# Patient Record
Sex: Male | Born: 1960 | Race: White | Hispanic: No | Marital: Single | Smoking: Former smoker
Health system: Southern US, Community
[De-identification: ages and names within clinical notes are randomized; demographics above are authoritative.]

## PROBLEM LIST (undated history)

## (undated) DIAGNOSIS — F32A Depression, unspecified: Secondary | ICD-10-CM

## (undated) DIAGNOSIS — Z87442 Personal history of urinary calculi: Secondary | ICD-10-CM

## (undated) DIAGNOSIS — G4730 Sleep apnea, unspecified: Secondary | ICD-10-CM

## (undated) DIAGNOSIS — F329 Major depressive disorder, single episode, unspecified: Secondary | ICD-10-CM

## (undated) DIAGNOSIS — I10 Essential (primary) hypertension: Secondary | ICD-10-CM

## (undated) DIAGNOSIS — E119 Type 2 diabetes mellitus without complications: Secondary | ICD-10-CM

## (undated) HISTORY — PX: CHOLECYSTECTOMY: SHX55

---

## 2007-10-28 ENCOUNTER — Emergency Department (HOSPITAL_COMMUNITY): Admission: EM | Admit: 2007-10-28 | Discharge: 2007-10-29 | Payer: Self-pay | Admitting: Emergency Medicine

## 2008-03-30 ENCOUNTER — Emergency Department (HOSPITAL_COMMUNITY): Admission: EM | Admit: 2008-03-30 | Discharge: 2008-03-30 | Payer: Self-pay | Admitting: Emergency Medicine

## 2011-05-26 LAB — DIFFERENTIAL
Lymphocytes Relative: 9 — ABNORMAL LOW
Lymphs Abs: 1.2
Monocytes Absolute: 0.9
Monocytes Relative: 6
Neutro Abs: 11.8 — ABNORMAL HIGH
Neutrophils Relative %: 85 — ABNORMAL HIGH

## 2011-05-26 LAB — BASIC METABOLIC PANEL
CO2: 22
Calcium: 8.8
Creatinine, Ser: 0.65
GFR calc Af Amer: >60
Sodium: 132 — ABNORMAL LOW

## 2011-05-26 LAB — URINALYSIS, ROUTINE W REFLEX MICROSCOPIC
Bilirubin Urine: NEGATIVE
Glucose, UA: NEGATIVE
Hgb urine dipstick: NEGATIVE
Specific Gravity, Urine: >1.046 — ABNORMAL HIGH
Urobilinogen, UA: 0.2
pH: 5

## 2011-05-26 LAB — CBC
Hemoglobin: 11.4 — ABNORMAL LOW
MCHC: 33.6
RBC: 3.85 — ABNORMAL LOW
WBC: 13.8 — ABNORMAL HIGH

## 2011-05-26 LAB — URINE MICROSCOPIC-ADD ON: Urine-Other: NONE SEEN

## 2011-06-02 LAB — POCT I-STAT, CHEM 8
BUN: 23
Creatinine, Ser: 0.9
Potassium: 3.9
Sodium: 139
TCO2: 21

## 2013-09-02 ENCOUNTER — Emergency Department (HOSPITAL_COMMUNITY): Payer: Non-veteran care

## 2013-09-02 ENCOUNTER — Encounter (HOSPITAL_COMMUNITY): Payer: Self-pay | Admitting: *Deleted

## 2013-09-02 ENCOUNTER — Inpatient Hospital Stay (HOSPITAL_COMMUNITY)
Admission: EM | Admit: 2013-09-02 | Discharge: 2013-09-16 | DRG: 963 | Disposition: A | Discharge status: Skilled Nursing Facility | Payer: Non-veteran care | Attending: General Surgery | Admitting: General Surgery

## 2013-09-02 DIAGNOSIS — S2243XA Multiple fractures of ribs, bilateral, initial encounter for closed fracture: Secondary | ICD-10-CM

## 2013-09-02 DIAGNOSIS — S2220XA Unspecified fracture of sternum, initial encounter for closed fracture: Principal | ICD-10-CM

## 2013-09-02 DIAGNOSIS — F3289 Other specified depressive episodes: Secondary | ICD-10-CM | POA: Diagnosis present

## 2013-09-02 DIAGNOSIS — F329 Major depressive disorder, single episode, unspecified: Secondary | ICD-10-CM | POA: Diagnosis present

## 2013-09-02 DIAGNOSIS — S32009A Unspecified fracture of unspecified lumbar vertebra, initial encounter for closed fracture: Secondary | ICD-10-CM

## 2013-09-02 DIAGNOSIS — I959 Hypotension, unspecified: Secondary | ICD-10-CM | POA: Diagnosis present

## 2013-09-02 DIAGNOSIS — F4310 Post-traumatic stress disorder, unspecified: Secondary | ICD-10-CM | POA: Diagnosis present

## 2013-09-02 DIAGNOSIS — I10 Essential (primary) hypertension: Secondary | ICD-10-CM | POA: Diagnosis present

## 2013-09-02 DIAGNOSIS — S36899A Unspecified injury of other intra-abdominal organs, initial encounter: Secondary | ICD-10-CM | POA: Diagnosis present

## 2013-09-02 DIAGNOSIS — K5900 Constipation, unspecified: Secondary | ICD-10-CM | POA: Diagnosis not present

## 2013-09-02 DIAGNOSIS — S32029A Unspecified fracture of second lumbar vertebra, initial encounter for closed fracture: Secondary | ICD-10-CM | POA: Diagnosis present

## 2013-09-02 DIAGNOSIS — E876 Hypokalemia: Secondary | ICD-10-CM | POA: Diagnosis not present

## 2013-09-02 DIAGNOSIS — Z87891 Personal history of nicotine dependence: Secondary | ICD-10-CM

## 2013-09-02 DIAGNOSIS — J9600 Acute respiratory failure, unspecified whether with hypoxia or hypercapnia: Secondary | ICD-10-CM | POA: Diagnosis present

## 2013-09-02 DIAGNOSIS — R490 Dysphonia: Secondary | ICD-10-CM | POA: Clinically undetermined

## 2013-09-02 DIAGNOSIS — S27329A Contusion of lung, unspecified, initial encounter: Secondary | ICD-10-CM | POA: Diagnosis present

## 2013-09-02 DIAGNOSIS — S2249XA Multiple fractures of ribs, unspecified side, initial encounter for closed fracture: Secondary | ICD-10-CM

## 2013-09-02 DIAGNOSIS — Z79899 Other long term (current) drug therapy: Secondary | ICD-10-CM

## 2013-09-02 DIAGNOSIS — K560 Paralytic ileus: Secondary | ICD-10-CM | POA: Diagnosis not present

## 2013-09-02 DIAGNOSIS — N390 Urinary tract infection, site not specified: Secondary | ICD-10-CM | POA: Diagnosis present

## 2013-09-02 DIAGNOSIS — S2242XA Multiple fractures of ribs, left side, initial encounter for closed fracture: Secondary | ICD-10-CM

## 2013-09-02 DIAGNOSIS — N179 Acute kidney failure, unspecified: Secondary | ICD-10-CM

## 2013-09-02 DIAGNOSIS — R1310 Dysphagia, unspecified: Secondary | ICD-10-CM | POA: Clinically undetermined

## 2013-09-02 HISTORY — DX: Depression, unspecified: F32.A

## 2013-09-02 HISTORY — DX: Essential (primary) hypertension: I10

## 2013-09-02 HISTORY — DX: Major depressive disorder, single episode, unspecified: F32.9

## 2013-09-02 LAB — POCT I-STAT, CHEM 8
BUN: 21 mg/dL (ref 6–23)
Creatinine, Ser: 0.8 mg/dL (ref 0.50–1.35)
Hemoglobin: 15.3 g/dL (ref 13.0–17.0)
Potassium: 3.9 mEq/L (ref 3.7–5.3)
Sodium: 139 mEq/L (ref 137–147)
TCO2: 22 mmol/L (ref 0–100)

## 2013-09-02 LAB — CBC WITH DIFFERENTIAL/PLATELET
Basophils Absolute: 0.1 10*3/uL (ref 0.0–0.1)
Basophils Relative: 1 % (ref 0–1)
Eosinophils Absolute: 0.1 10*3/uL (ref 0.0–0.7)
HCT: 42.7 % (ref 39.0–52.0)
Hemoglobin: 14.7 g/dL (ref 13.0–17.0)
Lymphocytes Relative: 25 % (ref 12–46)
MCHC: 34.4 g/dL (ref 30.0–36.0)
Monocytes Relative: 7 % (ref 3–12)
Neutro Abs: 9.3 10*3/uL — ABNORMAL HIGH (ref 1.7–7.7)
Neutrophils Relative %: 66 % (ref 43–77)
RBC: 4.69 MIL/uL (ref 4.22–5.81)
RDW: 15 % (ref 11.5–15.5)
WBC: 14 10*3/uL — ABNORMAL HIGH (ref 4.0–10.5)

## 2013-09-02 LAB — POCT I-STAT 3, ART BLOOD GAS (G3+)
Acid-base deficit: 7 mmol/L — ABNORMAL HIGH (ref 0.0–2.0)
Bicarbonate: 20.3 mEq/L (ref 20.0–24.0)
O2 Saturation: 99 %
TCO2: 22 mmol/L (ref 0–100)
pO2, Arterial: 152 mmHg — ABNORMAL HIGH (ref 80.0–100.0)

## 2013-09-02 LAB — COMPREHENSIVE METABOLIC PANEL
ALT: 119 U/L — ABNORMAL HIGH (ref 0–53)
AST: 85 U/L — ABNORMAL HIGH (ref 0–37)
Albumin: 3.6 g/dL (ref 3.5–5.2)
Alkaline Phosphatase: 106 U/L (ref 39–117)
CO2: 19 mEq/L (ref 19–32)
Calcium: 9.2 mg/dL (ref 8.4–10.5)
Glucose, Bld: 178 mg/dL — ABNORMAL HIGH (ref 70–99)
Potassium: 3.9 mEq/L (ref 3.7–5.3)
Sodium: 139 mEq/L (ref 137–147)
Total Protein: 6.8 g/dL (ref 6.0–8.3)

## 2013-09-02 LAB — URINALYSIS, ROUTINE W REFLEX MICROSCOPIC
Bilirubin Urine: NEGATIVE
Glucose, UA: 100 mg/dL — AB
Leukocytes, UA: NEGATIVE
Nitrite: NEGATIVE
Specific Gravity, Urine: 1.031 — ABNORMAL HIGH (ref 1.005–1.030)
pH: 6.5 (ref 5.0–8.0)

## 2013-09-02 LAB — SAMPLE TO BLOOD BANK

## 2013-09-02 LAB — URINE MICROSCOPIC-ADD ON

## 2013-09-02 LAB — PROTIME-INR
INR: 0.91 (ref 0.00–1.49)
INR: 1.08 (ref 0.00–1.49)

## 2013-09-02 LAB — APTT: aPTT: 26 seconds (ref 24–37)

## 2013-09-02 LAB — CG4 I-STAT (LACTIC ACID): Lactic Acid, Venous: 2.59 mmol/L — ABNORMAL HIGH (ref 0.5–2.2)

## 2013-09-02 MED ORDER — SUCCINYLCHOLINE CHLORIDE 20 MG/ML IJ SOLN
100.0000 mg | Freq: Once | INTRAMUSCULAR | Status: AC
  Administered 2013-09-02: 100 mg via INTRAVENOUS

## 2013-09-02 MED ORDER — SUCCINYLCHOLINE CHLORIDE 20 MG/ML IJ SOLN
INTRAMUSCULAR | Status: AC
  Filled 2013-09-02: qty 1

## 2013-09-02 MED ORDER — SODIUM CHLORIDE 0.9 % IV BOLUS (SEPSIS)
1000.0000 mL | Freq: Once | INTRAVENOUS | Status: AC
  Administered 2013-09-02: 1000 mL via INTRAVENOUS

## 2013-09-02 MED ORDER — MIDAZOLAM HCL 2 MG/2ML IJ SOLN
2.0000 mg | Freq: Once | INTRAMUSCULAR | Status: AC
  Administered 2013-09-02: 2 mg via INTRAVENOUS

## 2013-09-02 MED ORDER — PROPOFOL 10 MG/ML IV EMUL
0.0000 ug/kg/min | INTRAVENOUS | Status: DC
  Administered 2013-09-02: 50 ug/kg/min via INTRAVENOUS
  Administered 2013-09-02 (×3): 40 ug/kg/min via INTRAVENOUS
  Administered 2013-09-03: 45.09 ug/kg/min via INTRAVENOUS
  Administered 2013-09-03: 40 ug/kg/min via INTRAVENOUS
  Administered 2013-09-03: 45 ug/kg/min via INTRAVENOUS
  Administered 2013-09-03: 50 ug/kg/min via INTRAVENOUS
  Administered 2013-09-03: 40 ug/kg/min via INTRAVENOUS
  Administered 2013-09-03: 50 ug/kg/min via INTRAVENOUS
  Administered 2013-09-03 – 2013-09-04 (×3): 40 ug/kg/min via INTRAVENOUS
  Administered 2013-09-04: 30 ug/kg/min via INTRAVENOUS
  Administered 2013-09-04: 40 ug/kg/min via INTRAVENOUS
  Administered 2013-09-04: 30 ug/kg/min via INTRAVENOUS
  Administered 2013-09-05: 50 ug/kg/min via INTRAVENOUS
  Filled 2013-09-02 (×19): qty 100

## 2013-09-02 MED ORDER — POTASSIUM CHLORIDE IN NACL 20-0.45 MEQ/L-% IV SOLN
INTRAVENOUS | Status: DC
  Administered 2013-09-02 – 2013-09-03 (×3): via INTRAVENOUS
  Administered 2013-09-04: 100 mL/h via INTRAVENOUS
  Administered 2013-09-04: 16:00:00 via INTRAVENOUS
  Administered 2013-09-06: 100 mL via INTRAVENOUS
  Administered 2013-09-06 (×2): via INTRAVENOUS
  Administered 2013-09-07: 100 mL via INTRAVENOUS
  Administered 2013-09-08: 04:00:00 via INTRAVENOUS
  Filled 2013-09-02 (×19): qty 1000

## 2013-09-02 MED ORDER — FENTANYL CITRATE 0.05 MG/ML IJ SOLN
100.0000 ug | INTRAMUSCULAR | Status: DC | PRN
  Administered 2013-09-02: 100 ug via INTRAVENOUS
  Filled 2013-09-02: qty 2

## 2013-09-02 MED ORDER — IOHEXOL 300 MG/ML  SOLN
100.0000 mL | Freq: Once | INTRAMUSCULAR | Status: AC | PRN
  Administered 2013-09-02: 100 mL via INTRAVENOUS

## 2013-09-02 MED ORDER — ROCURONIUM BROMIDE 50 MG/5ML IV SOLN
INTRAVENOUS | Status: AC
  Filled 2013-09-02: qty 2

## 2013-09-02 MED ORDER — FENTANYL CITRATE 0.05 MG/ML IJ SOLN
50.0000 ug | Freq: Once | INTRAMUSCULAR | Status: AC
  Administered 2013-09-02: 50 ug via INTRAVENOUS

## 2013-09-02 MED ORDER — ENOXAPARIN SODIUM 30 MG/0.3ML ~~LOC~~ SOLN
30.0000 mg | Freq: Two times a day (BID) | SUBCUTANEOUS | Status: DC
  Administered 2013-09-02 – 2013-09-08 (×13): 30 mg via SUBCUTANEOUS
  Filled 2013-09-02 (×16): qty 0.3

## 2013-09-02 MED ORDER — FENTANYL CITRATE 0.05 MG/ML IJ SOLN
INTRAMUSCULAR | Status: AC
  Filled 2013-09-02: qty 2

## 2013-09-02 MED ORDER — MIDAZOLAM HCL 2 MG/2ML IJ SOLN
INTRAMUSCULAR | Status: AC
  Filled 2013-09-02: qty 2

## 2013-09-02 MED ORDER — PROPOFOL 10 MG/ML IV EMUL
INTRAVENOUS | Status: AC
  Administered 2013-09-02: 1000 mg
  Filled 2013-09-02: qty 100

## 2013-09-02 MED ORDER — BIOTENE DRY MOUTH MT LIQD
15.0000 mL | Freq: Four times a day (QID) | OROMUCOSAL | Status: DC
  Administered 2013-09-02 – 2013-09-12 (×41): 15 mL via OROMUCOSAL

## 2013-09-02 MED ORDER — ETOMIDATE 2 MG/ML IV SOLN
INTRAVENOUS | Status: AC
  Filled 2013-09-02: qty 20

## 2013-09-02 MED ORDER — LIDOCAINE HCL (CARDIAC) 20 MG/ML IV SOLN
INTRAVENOUS | Status: AC
  Filled 2013-09-02: qty 5

## 2013-09-02 MED ORDER — FENTANYL BOLUS VIA INFUSION
50.0000 ug | INTRAVENOUS | Status: DC | PRN
  Administered 2013-09-02: 50 ug via INTRAVENOUS
  Filled 2013-09-02: qty 100

## 2013-09-02 MED ORDER — ONDANSETRON HCL 4 MG/2ML IJ SOLN: 4.0000 mg | Freq: Four times a day (QID) | INTRAMUSCULAR | Status: DC | PRN

## 2013-09-02 MED ORDER — ETOMIDATE 2 MG/ML IV SOLN
20.0000 mg | Freq: Once | INTRAVENOUS | Status: AC
  Administered 2013-09-02: 20 mg via INTRAVENOUS

## 2013-09-02 MED ORDER — CHLORHEXIDINE GLUCONATE 0.12 % MT SOLN
15.0000 mL | Freq: Two times a day (BID) | OROMUCOSAL | Status: DC
  Administered 2013-09-02 – 2013-09-12 (×22): 15 mL via OROMUCOSAL
  Filled 2013-09-02 (×21): qty 15

## 2013-09-02 MED ORDER — CIPROFLOXACIN IN D5W 400 MG/200ML IV SOLN
400.0000 mg | Freq: Two times a day (BID) | INTRAVENOUS | Status: DC
  Administered 2013-09-02 – 2013-09-06 (×8): 400 mg via INTRAVENOUS
  Filled 2013-09-02 (×10): qty 200

## 2013-09-02 MED ORDER — ONDANSETRON HCL 4 MG PO TABS: 4.0000 mg | ORAL_TABLET | Freq: Four times a day (QID) | ORAL | Status: DC | PRN

## 2013-09-02 MED ORDER — FENTANYL CITRATE 0.05 MG/ML IJ SOLN
25.0000 ug | Freq: Once | INTRAMUSCULAR | Status: AC
  Administered 2013-09-02: 25 ug via INTRAVENOUS

## 2013-09-02 MED ORDER — PANTOPRAZOLE SODIUM 40 MG PO TBEC: 40.0000 mg | DELAYED_RELEASE_TABLET | Freq: Every day | ORAL | Status: DC

## 2013-09-02 MED ORDER — PANTOPRAZOLE SODIUM 40 MG IV SOLR
40.0000 mg | Freq: Every day | INTRAVENOUS | Status: DC
  Administered 2013-09-02 – 2013-09-08 (×7): 40 mg via INTRAVENOUS
  Filled 2013-09-02 (×7): qty 40

## 2013-09-02 MED ORDER — SODIUM CHLORIDE 0.9 % IV SOLN
0.0000 ug/h | INTRAVENOUS | Status: DC
  Administered 2013-09-02: 50 ug/h via INTRAVENOUS
  Administered 2013-09-03: 150 ug/h via INTRAVENOUS
  Administered 2013-09-04: 100 ug/h via INTRAVENOUS
  Administered 2013-09-05: 175 ug/h via INTRAVENOUS
  Filled 2013-09-02 (×5): qty 50

## 2013-09-02 NOTE — Progress Notes (Signed)
ETT pulled back 2 cm. ETT now at 24cm at the lip per MD order.

## 2013-09-02 NOTE — Progress Notes (Signed)
Pt transported to CT with no complications. °
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
## 2013-09-02 NOTE — ED Notes (Signed)
Pt has abrasions to left knee and shin, with bruising noted to right knee.  Pt has lac above right eyebrow with bleeding controlled prior to arrival

## 2013-09-02 NOTE — ED Notes (Signed)
ET tube pulled back to 24 at lips

## 2013-09-02 NOTE — Progress Notes (Signed)
UR completed.  Shahrzad Koble, RN BSN [facility] CCM Trauma/Neuro ICU Case Manager [phone]  
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
## 2013-09-02 NOTE — Progress Notes (Signed)
Called about fevers up to 100.7.  Patient has a positive UA.  Will start on Cipro.  Marta Lamas. Gae Bon, MD, FACS [phone] Trauma Surgeon

## 2013-09-02 NOTE — Progress Notes (Signed)
Dr. Janee Morn notified of patient's agitation and restlessness with fractures despite propofol. Orders received for fentanyl drip. Will monitor.

## 2013-09-02 NOTE — ED Notes (Signed)
Pt intubed by Dr Hyacinth Meeker with 8.0 tube placed at 26 at lips.

## 2013-09-02 NOTE — ED Notes (Signed)
Applied aspen collar
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
## 2013-09-02 NOTE — Progress Notes (Signed)
Per Dr. Yetta Barre, pt's [facility] okay to go up as high as 30 degrees. Order placed.

## 2013-09-02 NOTE — Progress Notes (Signed)
Intubated by MD at [phone] with no complications. 8.0 ETT at 26. Good color change on End Tidal CO2. BBS heard. ETT withdrew 2cm post x-ray per MD.

## 2013-09-03 ENCOUNTER — Inpatient Hospital Stay (HOSPITAL_COMMUNITY): Payer: Non-veteran care

## 2013-09-03 ENCOUNTER — Encounter (HOSPITAL_COMMUNITY): Payer: Self-pay | Admitting: *Deleted

## 2013-09-03 LAB — CBC
HCT: 34.1 % — ABNORMAL LOW (ref 39.0–52.0)
Hemoglobin: 11.5 g/dL — ABNORMAL LOW (ref 13.0–17.0)
MCH: 31.4 pg (ref 26.0–34.0)
MCV: 93.2 fL (ref 78.0–100.0)
RBC: 3.66 MIL/uL — ABNORMAL LOW (ref 4.22–5.81)
RDW: 15.3 % (ref 11.5–15.5)
WBC: 10.3 10*3/uL (ref 4.0–10.5)

## 2013-09-03 LAB — BASIC METABOLIC PANEL
BUN: 10 mg/dL (ref 6–23)
CO2: 20 mEq/L (ref 19–32)
Calcium: 8 mg/dL — ABNORMAL LOW (ref 8.4–10.5)
Chloride: 105 mEq/L (ref 96–112)
Creatinine, Ser: 0.61 mg/dL (ref 0.50–1.35)
GFR calc Af Amer: >90 mL/min (ref >90)
Glucose, Bld: 146 mg/dL — ABNORMAL HIGH (ref 70–99)

## 2013-09-03 LAB — GLUCOSE, CAPILLARY
Glucose-Capillary: 132 mg/dL — ABNORMAL HIGH (ref 70–99)
Glucose-Capillary: 137 mg/dL — ABNORMAL HIGH (ref 70–99)

## 2013-09-03 MED ORDER — INSULIN ASPART 100 UNIT/ML ~~LOC~~ SOLN
0.0000 [IU] | SUBCUTANEOUS | Status: DC
  Administered 2013-09-03 – 2013-09-08 (×12): 1 [IU] via SUBCUTANEOUS
  Administered 2013-09-09: 2 [IU] via SUBCUTANEOUS
  Administered 2013-09-09 (×2): 1 [IU] via SUBCUTANEOUS
  Administered 2013-09-10: 2 [IU] via SUBCUTANEOUS
  Administered 2013-09-11 (×3): 1 [IU] via SUBCUTANEOUS
  Administered 2013-09-12: 0 [IU] via SUBCUTANEOUS
  Administered 2013-09-12 – 2013-09-15 (×4): 1 [IU] via SUBCUTANEOUS

## 2013-09-03 MED ORDER — FUROSEMIDE 10 MG/ML IJ SOLN
20.0000 mg | Freq: Once | INTRAMUSCULAR | Status: AC
  Administered 2013-09-03: 20 mg via INTRAVENOUS
  Filled 2013-09-03: qty 2
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
## 2013-09-04 ENCOUNTER — Inpatient Hospital Stay (HOSPITAL_COMMUNITY): Payer: Non-veteran care

## 2013-09-04 DIAGNOSIS — J95821 Acute postprocedural respiratory failure: Secondary | ICD-10-CM

## 2013-09-04 LAB — BASIC METABOLIC PANEL
BUN: 8 mg/dL (ref 6–23)
CO2: 22 mEq/L (ref 19–32)
Calcium: 8.2 mg/dL — ABNORMAL LOW (ref 8.4–10.5)
Chloride: 102 mEq/L (ref 96–112)
Creatinine, Ser: 0.5 mg/dL (ref 0.50–1.35)
GFR calc Af Amer: >90 mL/min (ref >90)
GFR calc non Af Amer: >90 mL/min (ref >90)
Glucose, Bld: 136 mg/dL — ABNORMAL HIGH (ref 70–99)
Potassium: 3.7 mEq/L (ref 3.7–5.3)
Sodium: 138 mEq/L (ref 137–147)

## 2013-09-04 LAB — GLUCOSE, CAPILLARY
GLUCOSE-CAPILLARY: 133 mg/dL — AB (ref 70–99)
Glucose-Capillary: 132 mg/dL — ABNORMAL HIGH (ref 70–99)
Glucose-Capillary: 138 mg/dL — ABNORMAL HIGH (ref 70–99)
Glucose-Capillary: 136 mg/dL — ABNORMAL HIGH (ref 70–99)
Glucose-Capillary: 108 mg/dL — ABNORMAL HIGH (ref 70–99)
Glucose-Capillary: 143 mg/dL — ABNORMAL HIGH (ref 70–99)

## 2013-09-04 LAB — URINE CULTURE
Colony Count: NO GROWTH
Culture: NO GROWTH

## 2013-09-04 LAB — CBC
HCT: 30.3 % — ABNORMAL LOW (ref 39.0–52.0)
Hemoglobin: 10.2 g/dL — ABNORMAL LOW (ref 13.0–17.0)
MCH: 30.8 pg (ref 26.0–34.0)
MCHC: 33.7 g/dL (ref 30.0–36.0)
MCV: 91.5 fL (ref 78.0–100.0)
Platelets: 177 10*3/uL (ref 150–400)
RBC: 3.31 MIL/uL — ABNORMAL LOW (ref 4.22–5.81)
RDW: 14.9 % (ref 11.5–15.5)
WBC: 9.6 10*3/uL (ref 4.0–10.5)
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
## 2013-09-05 LAB — GLUCOSE, CAPILLARY
GLUCOSE-CAPILLARY: 116 mg/dL — AB (ref 70–99)
Glucose-Capillary: 94 mg/dL (ref 70–99)
Glucose-Capillary: 124 mg/dL — ABNORMAL HIGH (ref 70–99)
Glucose-Capillary: 149 mg/dL — ABNORMAL HIGH (ref 70–99)
Glucose-Capillary: 128 mg/dL — ABNORMAL HIGH (ref 70–99)
Glucose-Capillary: 112 mg/dL — ABNORMAL HIGH (ref 70–99)

## 2013-09-05 LAB — TRIGLYCERIDES: TRIGLYCERIDES: 358 mg/dL — AB (ref <150)

## 2013-09-05 MED ORDER — HYDROMORPHONE HCL PF 1 MG/ML IJ SOLN
1.0000 mg | INTRAMUSCULAR | Status: DC | PRN
  Administered 2013-09-05 – 2013-09-06 (×7): 1 mg via INTRAVENOUS
  Filled 2013-09-05 (×7): qty 1

## 2013-09-05 MED ORDER — FENTANYL CITRATE 0.05 MG/ML IJ SOLN
50.0000 ug | INTRAMUSCULAR | Status: DC | PRN
  Administered 2013-09-05: 50 ug via INTRAVENOUS
  Administered 2013-09-05: 100 ug via INTRAVENOUS
  Administered 2013-09-05: 50 ug via INTRAVENOUS
  Administered 2013-09-05: 100 ug via INTRAVENOUS
  Filled 2013-09-05 (×4): qty 2

## 2013-09-05 MED ORDER — OXYCODONE HCL 5 MG PO TABS
5.0000 mg | ORAL_TABLET | ORAL | Status: DC | PRN
  Filled 2013-09-05: qty 1

## 2013-09-05 NOTE — Progress Notes (Signed)
UR completed.  Pt just extubated today. Await therapy evals to determine needs for d/c.   Carlyle LipaMichelle Latanza Pfefferkorn, RN BSN MHA CCM Trauma/Neuro ICU Case Manager [phone]
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
## 2013-09-05 NOTE — Progress Notes (Signed)
SLP Cancellation Note  Patient Details Name: Lorra HalsCharles Calbert MRN: [id number] DOB: 12-10-1960   Cancelled treatment:       Reason Eval/Treat Not Completed: Pain limiting ability to participate;Medical issues which prohibited therapy. RN requested SLP eval pts swallowing due to coughing noted with clear liquid diet. SLP arrived in pm to assess pt, but pt declined assessment because he did not want to put on brace to sit upright due to pain. In any case, pt is aphonic following several days intubation and is not likely to swallow thin liquids safely until 24-48 hours following extubation. Pt likely to have airway edema, decreased airway protection and high risk of silent aspiration. Recommend NPO status until pt is evaluated by SLP.    Annistyn Depass, Riley NearingBonnie Caroline 09/05/2013, 3:28 PM

## 2013-09-05 NOTE — Clinical Social Work Note (Signed)
Pt remains on vent.  CSW continuing to follow.

## 2013-09-05 NOTE — Progress Notes (Signed)
Fentanyl 257500mcg/250mL bag wasted. 140cc wasted. Benna Dunksaryn, RN as witness.   Holly Bodilyulbertson, Sire Poet Leigh

## 2013-09-05 NOTE — Procedures (Signed)
**Note De-Identified [place] Obfuscation** Extubation Procedure Note  Patient Details:   Name: Brian Cisneros DOB: 1961-08-03 MRN: [id number]   Airway Documentation:  Airway 8 mm (Active)  Secured at (cm) 24 cm 09/05/2013  8:16 AM  Measured From Lips 09/05/2013  8:16 AM  Secured Location Center 09/05/2013  8:16 AM  Secured By [employer] Tube Holder 09/05/2013  8:16 AM  Tube Holder Repositioned Yes 09/05/2013  8:16 AM  Cuff Pressure (cm H2O) 22 cm H2O 09/05/2013  3:32 AM  Site Condition Dry 09/05/2013  8:16 AM    Evaluation  O2 sats: stable throughout Complications: No apparent complications Patient did tolerate procedure well. Bilateral Breath Sounds: Rhonchi Suctioning: Airway Yes No stridor noted, + leak Jontay Maston, Megan SalonWendy Cooper 09/05/2013, 11:07 AM
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
## 2013-09-06 ENCOUNTER — Inpatient Hospital Stay (HOSPITAL_COMMUNITY): Payer: Non-veteran care

## 2013-09-06 DIAGNOSIS — S2220XA Unspecified fracture of sternum, initial encounter for closed fracture: Secondary | ICD-10-CM

## 2013-09-06 LAB — CBC
HEMATOCRIT: 30.4 % — AB (ref 39.0–52.0)
HEMOGLOBIN: 10.2 g/dL — AB (ref 13.0–17.0)
MCH: 30.7 pg (ref 26.0–34.0)
MCHC: 33.6 g/dL (ref 30.0–36.0)
MCV: 91.6 fL (ref 78.0–100.0)
Platelets: 218 10*3/uL (ref 150–400)
RBC: 3.32 MIL/uL — ABNORMAL LOW (ref 4.22–5.81)
RDW: 14.6 % (ref 11.5–15.5)
WBC: 9.9 10*3/uL (ref 4.0–10.5)

## 2013-09-06 LAB — BASIC METABOLIC PANEL
BUN: 8 mg/dL (ref 6–23)
CO2: 23 meq/L (ref 19–32)
Calcium: 8.5 mg/dL (ref 8.4–10.5)
Chloride: 100 mEq/L (ref 96–112)
Creatinine, Ser: 0.47 mg/dL — ABNORMAL LOW (ref 0.50–1.35)
GFR calc Af Amer: >90 mL/min (ref >90)
GFR calc non Af Amer: >90 mL/min (ref >90)
GLUCOSE: 117 mg/dL — AB (ref 70–99)
POTASSIUM: 3.3 meq/L — AB (ref 3.7–5.3)
SODIUM: 138 meq/L (ref 137–147)

## 2013-09-06 LAB — GLUCOSE, CAPILLARY
GLUCOSE-CAPILLARY: 102 mg/dL — AB (ref 70–99)
GLUCOSE-CAPILLARY: 109 mg/dL — AB (ref 70–99)
GLUCOSE-CAPILLARY: 90 mg/dL (ref 70–99)
Glucose-Capillary: 101 mg/dL — ABNORMAL HIGH (ref 70–99)
Glucose-Capillary: 104 mg/dL — ABNORMAL HIGH (ref 70–99)
Glucose-Capillary: 107 mg/dL — ABNORMAL HIGH (ref 70–99)
Glucose-Capillary: 108 mg/dL — ABNORMAL HIGH (ref 70–99)

## 2013-09-06 MED ORDER — ONDANSETRON HCL 4 MG/2ML IJ SOLN: 4.0000 mg | Freq: Four times a day (QID) | INTRAMUSCULAR | Status: DC | PRN

## 2013-09-06 MED ORDER — HYDROMORPHONE 0.3 MG/ML IV SOLN: INTRAVENOUS | Status: DC

## 2013-09-06 MED ORDER — SODIUM CHLORIDE 0.9 % IJ SOLN: 9.0000 mL | INTRAMUSCULAR | Status: DC | PRN

## 2013-09-06 MED ORDER — DIPHENHYDRAMINE HCL 50 MG/ML IJ SOLN: 12.5000 mg | Freq: Four times a day (QID) | INTRAMUSCULAR | Status: DC | PRN

## 2013-09-06 MED ORDER — POTASSIUM CHLORIDE 10 MEQ/100ML IV SOLN
10.0000 meq | INTRAVENOUS | Status: AC
  Administered 2013-09-06 (×4): 10 meq via INTRAVENOUS
  Filled 2013-09-06 (×4): qty 100

## 2013-09-06 MED ORDER — HYDROMORPHONE 0.3 MG/ML IV SOLN
INTRAVENOUS | Status: DC
  Administered 2013-09-06: 4.01 mg via INTRAVENOUS
  Administered 2013-09-06: 3.3 mg via INTRAVENOUS
  Administered 2013-09-06 (×3): via INTRAVENOUS
  Administered 2013-09-06: 3.5 mg via INTRAVENOUS
  Administered 2013-09-06: 3.42 mg via INTRAVENOUS
  Administered 2013-09-07: 3.3 mg via INTRAVENOUS
  Administered 2013-09-07: 12:00:00 via INTRAVENOUS
  Administered 2013-09-07: 5.1 mg via INTRAVENOUS
  Administered 2013-09-07: 05:00:00 via INTRAVENOUS
  Administered 2013-09-07: 4.8 mg via INTRAVENOUS
  Administered 2013-09-07: 3.6 mg via INTRAVENOUS
  Administered 2013-09-07: 19:00:00 via INTRAVENOUS
  Filled 2013-09-06 (×6): qty 25

## 2013-09-06 MED ORDER — DIPHENHYDRAMINE HCL 12.5 MG/5ML PO ELIX: 12.5000 mg | ORAL_SOLUTION | Freq: Four times a day (QID) | ORAL | Status: DC | PRN

## 2013-09-06 MED ORDER — NALOXONE HCL 0.4 MG/ML IJ SOLN: 0.4000 mg | INTRAMUSCULAR | Status: DC | PRN

## 2013-09-06 MED ORDER — POTASSIUM CHLORIDE 10 MEQ/100ML IV SOLN
10.0000 meq | INTRAVENOUS | Status: DC
  Administered 2013-09-06: 10 meq via INTRAVENOUS

## 2013-09-06 MED ORDER — DIPHENHYDRAMINE HCL 12.5 MG/5ML PO ELIX
12.5000 mg | ORAL_SOLUTION | Freq: Four times a day (QID) | ORAL | Status: DC | PRN
  Filled 2013-09-06: qty 5

## 2013-09-06 NOTE — Progress Notes (Signed)
Overall stable. No new issues. Patient sitting up in his brace without difficulty.  No new recommendations. Continue current management.
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
## 2013-09-07 LAB — GLUCOSE, CAPILLARY
GLUCOSE-CAPILLARY: 102 mg/dL — AB (ref 70–99)
GLUCOSE-CAPILLARY: 100 mg/dL — AB (ref 70–99)
Glucose-Capillary: 106 mg/dL — ABNORMAL HIGH (ref 70–99)
Glucose-Capillary: 92 mg/dL (ref 70–99)
Glucose-Capillary: 95 mg/dL (ref 70–99)
Glucose-Capillary: 105 mg/dL — ABNORMAL HIGH (ref 70–99)

## 2013-09-07 LAB — BASIC METABOLIC PANEL
BUN: 9 mg/dL (ref 6–23)
CALCIUM: 8.6 mg/dL (ref 8.4–10.5)
CO2: 19 mEq/L (ref 19–32)
Chloride: 99 mEq/L (ref 96–112)
Creatinine, Ser: 0.37 mg/dL — ABNORMAL LOW (ref 0.50–1.35)
Glucose, Bld: 112 mg/dL — ABNORMAL HIGH (ref 70–99)
POTASSIUM: 3.6 meq/L — AB (ref 3.7–5.3)
SODIUM: 134 meq/L — AB (ref 137–147)

## 2013-09-07 MED ORDER — SODIUM CHLORIDE 0.9 % IJ SOLN: 9.0000 mL | INTRAMUSCULAR | Status: DC | PRN

## 2013-09-07 MED ORDER — HYDROMORPHONE 0.3 MG/ML IV SOLN
INTRAVENOUS | Status: DC
  Administered 2013-09-07: 23:00:00 via INTRAVENOUS
  Administered 2013-09-08: 6.19 mg via INTRAVENOUS
  Administered 2013-09-08: 13:00:00 via INTRAVENOUS
  Administered 2013-09-08: 0.951 mg via INTRAVENOUS
  Administered 2013-09-08: 5.49 mg via INTRAVENOUS
  Administered 2013-09-08: 05:00:00 via INTRAVENOUS
  Administered 2013-09-08: 0.5 mg via INTRAVENOUS
  Administered 2013-09-08: 6 mg via INTRAVENOUS
  Administered 2013-09-08 (×2): via INTRAVENOUS
  Administered 2013-09-08: 9.48 mg via INTRAVENOUS
  Administered 2013-09-09: 11:00:00 via INTRAVENOUS
  Administered 2013-09-09: 0.5 mg via INTRAVENOUS
  Administered 2013-09-09: 10.97 mg via INTRAVENOUS
  Administered 2013-09-09: 25 mg via INTRAVENOUS
  Administered 2013-09-09: 13.99 mg via INTRAVENOUS
  Administered 2013-09-09: 19:00:00 via INTRAVENOUS
  Administered 2013-09-10: 0.399 mg via INTRAVENOUS
  Administered 2013-09-10: 0.499 mg via INTRAVENOUS
  Administered 2013-09-10: 08:00:00 via INTRAVENOUS
  Filled 2013-09-07 (×11): qty 25

## 2013-09-07 MED ORDER — HYDROMORPHONE 0.3 MG/ML IV SOLN: INTRAVENOUS | Status: DC

## 2013-09-07 MED ORDER — DIPHENHYDRAMINE HCL 12.5 MG/5ML PO ELIX
12.5000 mg | ORAL_SOLUTION | Freq: Four times a day (QID) | ORAL | Status: DC | PRN
  Filled 2013-09-07: qty 5

## 2013-09-07 MED ORDER — ONDANSETRON HCL 4 MG/2ML IJ SOLN: 4.0000 mg | Freq: Four times a day (QID) | INTRAMUSCULAR | Status: DC | PRN

## 2013-09-07 MED ORDER — DIPHENHYDRAMINE HCL 50 MG/ML IJ SOLN: 12.5000 mg | Freq: Four times a day (QID) | INTRAMUSCULAR | Status: DC | PRN

## 2013-09-07 MED ORDER — NALOXONE HCL 0.4 MG/ML IJ SOLN: 0.4000 mg | INTRAMUSCULAR | Status: DC | PRN

## 2013-09-07 MED ORDER — HYDROMORPHONE 0.3 MG/ML IV SOLN
INTRAVENOUS | Status: DC
  Administered 2013-09-07: 23:00:00 via INTRAVENOUS
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
## 2013-09-07 NOTE — Progress Notes (Signed)
3mL of Hydromorphone HCL monoject was WIS and witnessed by Lily Peerhomas Lilley, RN & Vanessa RalphsAmy Ventry-Smith, RN.

## 2013-09-07 NOTE — Progress Notes (Signed)
Subjective: Patient reports That he feels better today less pain is back he 7 no pain down his legs reports no numbness or tingling his legs or his feet.  Objective: Vital signs in last 24 hours: Temp:  [97.7 F (36.5 C)-98.5 F (36.9 C)] 98.5 F (36.9 C) (01/04 0403) Pulse Rate:  [85-100] 94 (01/04 0600) Resp:  [18-34] 18 (01/04 0600) BP: (111-142)/(71-93) 137/87 mmHg (01/04 0600) SpO2:  [92 %-99 %] 95 % (01/04 0600)  Intake/Output from previous day: 01/03 0701 - 01/04 0700 In: 2700 [I.V.:2300; IV Piggyback:400] Out: 2675 [Urine:2675] Intake/Output this shift:    Awake alert oriented strength out of 5 his lower extremities  Lab Results:  Recent Labs  09/06/13 0415  WBC 9.9  HGB 10.2*  HCT 30.4*  PLT 218   BMET  Recent Labs  09/06/13 0415 09/07/13 0335  NA 138 134*  K 3.3* 3.6*  CL 100 99  CO2 23 19  GLUCOSE 117* 112*  BUN 8 9  CREATININE 0.47* 0.37*  CALCIUM 8.5 8.6    Studies/Results: Dg Chest Port 1 View  09/06/2013   CLINICAL DATA:  Bilateral rib fractures  EXAM: PORTABLE CHEST - 1 VIEW  COMPARISON:  DG CHEST 1V PORT dated 09/04/2013  FINDINGS: Interval extubation and removal of NG tube. Stable cardiac silhouette. There is left lower lobe atelectasis mildly increased. . Rib fractures along the left lateral chest wall are again demonstrated. Small volume pleural fluid noted along the left lateral chest wall. No pneumothorax. No pulmonary edema.  IMPRESSION: 1. Interval extubation 2. Mild increase in left lower lobe atelectasis. 3. Left rib fractures and small effusion.   Electronically Signed   By: Genevive BiStewart  Edmunds M.D.   On: 09/06/2013 08:00    Assessment/Plan: Continue bedrest for now mobilization per trauma and Dr. Yetta BarreJones when he returns tomorrow  LOS: 5 days     Garnet Overfield P 09/07/2013, 8:02 AM
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
## 2013-09-08 LAB — GLUCOSE, CAPILLARY
Glucose-Capillary: 100 mg/dL — ABNORMAL HIGH (ref 70–99)
Glucose-Capillary: 111 mg/dL — ABNORMAL HIGH (ref 70–99)
Glucose-Capillary: 107 mg/dL — ABNORMAL HIGH (ref 70–99)
Glucose-Capillary: 118 mg/dL — ABNORMAL HIGH (ref 70–99)
Glucose-Capillary: 122 mg/dL — ABNORMAL HIGH (ref 70–99)
Glucose-Capillary: 127 mg/dL — ABNORMAL HIGH (ref 70–99)

## 2013-09-08 LAB — TRIGLYCERIDES: Triglycerides: 201 mg/dL — ABNORMAL HIGH (ref <150)

## 2013-09-08 NOTE — Progress Notes (Signed)
Trauma Service Note  Subjective: Patient seems to be doing fine.  Awake and alert.  Minimal complaints  Objective: Vital signs in last 24 hours: Temp:  [98 F (36.7 C)-98.8 F (37.1 C)] 98.2 F (36.8 C) (01/05 0700) Pulse Rate:  [86-99] 93 (01/05 0900) Resp:  [12-27] 16 (01/05 0904) BP: (92-139)/(67-104) 124/79 mmHg (01/05 0900) SpO2:  [92 %-99 %] 95 % (01/05 0904) FiO2 (%):  [92 %] 92 % (01/05 0357)    Intake/Output from previous day: 01/04 0701 - 01/05 0700 In: 2400 [I.V.:2400] Out: 1780 [Urine:1780] Intake/Output this shift: Total I/O In: 200 [I.V.:200] Out: 300 [Urine:300]  General: No acute distress.  Says the brace is sweaty  Lungs: Clear  Abd: Soft, benign  Extremities: No changes.  No DVT clinical signs or symptoms  Neuro: Intact  Lab Results: CBC   Recent Labs  09/06/13 0415  WBC 9.9  HGB 10.2*  HCT 30.4*  PLT 218   BMET  Recent Labs  09/06/13 0415 09/07/13 0335  NA 138 134*  K 3.3* 3.6*  CL 100 99  CO2 23 19  GLUCOSE 117* 112*  BUN 8 9  CREATININE 0.47* 0.37*  CALCIUM 8.5 8.6   PT/INR No results found for this basename: LABPROT, INR,  in the last 72 hours ABG No results found for this basename: PHART, PCO2, PO2, HCO3,  in the last 72 hours  Studies/Results: No results found.  Anti-infectives: Anti-infectives   Start     Dose/Rate Route Frequency Ordered Stop   09/02/13 2000  ciprofloxacin (CIPRO) IVPB 400 mg  Status:  Discontinued     400 mg 200 mL/hr over 60 Minutes Intravenous Every 12 hours 09/02/13 1738 09/06/13 0853      Assessment/Plan: s/p  Advance diet Mobilize with PT/OT Transfer to the floor.  LOS: 6 days   Marta LamasJames O. Gae BonWyatt, III, MD, FACS [phone] Trauma Surgeon 09/08/2013
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
## 2013-09-09 DIAGNOSIS — K5900 Constipation, unspecified: Secondary | ICD-10-CM

## 2013-09-09 LAB — GLUCOSE, CAPILLARY
GLUCOSE-CAPILLARY: 109 mg/dL — AB (ref 70–99)
GLUCOSE-CAPILLARY: 112 mg/dL — AB (ref 70–99)
GLUCOSE-CAPILLARY: 121 mg/dL — AB (ref 70–99)
Glucose-Capillary: 115 mg/dL — ABNORMAL HIGH (ref 70–99)
Glucose-Capillary: 139 mg/dL — ABNORMAL HIGH (ref 70–99)
Glucose-Capillary: 165 mg/dL — ABNORMAL HIGH (ref 70–99)

## 2013-09-09 LAB — BASIC METABOLIC PANEL
BUN: 10 mg/dL (ref 6–23)
CALCIUM: 8.7 mg/dL (ref 8.4–10.5)
CHLORIDE: 97 meq/L (ref 96–112)
CO2: 22 mEq/L (ref 19–32)
Creatinine, Ser: 0.44 mg/dL — ABNORMAL LOW (ref 0.50–1.35)
GFR calc non Af Amer: >90 mL/min (ref >90)
Glucose, Bld: 116 mg/dL — ABNORMAL HIGH (ref 70–99)
Potassium: 3.7 mEq/L (ref 3.7–5.3)
Sodium: 136 mEq/L — ABNORMAL LOW (ref 137–147)

## 2013-09-09 MED ORDER — DOCUSATE SODIUM 100 MG PO CAPS: 100.0000 mg | ORAL_CAPSULE | Freq: Two times a day (BID) | ORAL | Status: DC | PRN

## 2013-09-09 MED ORDER — PANTOPRAZOLE SODIUM 40 MG IV SOLR
40.0000 mg | INTRAVENOUS | Status: DC
  Administered 2013-09-09: 40 mg via INTRAVENOUS
  Filled 2013-09-09: qty 40

## 2013-09-09 MED ORDER — BISACODYL 10 MG RE SUPP
10.0000 mg | Freq: Every day | RECTAL | Status: DC | PRN
  Administered 2013-09-11: 10 mg via RECTAL
  Filled 2013-09-09: qty 1

## 2013-09-09 MED ORDER — POTASSIUM CHLORIDE 20 MEQ/15ML (10%) PO LIQD
20.0000 meq | Freq: Two times a day (BID) | ORAL | Status: DC
  Administered 2013-09-09 – 2013-09-16 (×15): 20 meq via ORAL
  Filled 2013-09-09 (×16): qty 15

## 2013-09-09 MED ORDER — GABAPENTIN 300 MG PO CAPS
300.0000 mg | ORAL_CAPSULE | Freq: Four times a day (QID) | ORAL | Status: DC
  Administered 2013-09-09 – 2013-09-16 (×27): 300 mg via ORAL
  Filled 2013-09-09 (×28): qty 1
  Filled 2013-09-09: qty 3
  Filled 2013-09-09 (×8): qty 1

## 2013-09-09 MED ORDER — POTASSIUM CHLORIDE CRYS ER 20 MEQ PO TBCR: 20.0000 meq | EXTENDED_RELEASE_TABLET | Freq: Two times a day (BID) | ORAL | Status: DC

## 2013-09-09 MED ORDER — ENSURE PUDDING PO PUDG
1.0000 | ORAL | Status: DC
  Administered 2013-09-09 – 2013-09-15 (×7): 1 via ORAL

## 2013-09-09 MED ORDER — SODIUM CHLORIDE 0.9 % IV SOLN
INTRAVENOUS | Status: DC
  Administered 2013-09-09 – 2013-09-10 (×2): via INTRAVENOUS

## 2013-09-09 MED ORDER — LISINOPRIL 10 MG PO TABS
10.0000 mg | ORAL_TABLET | Freq: Every day | ORAL | Status: DC
  Administered 2013-09-09: 10 mg via ORAL
  Filled 2013-09-09: qty 1
  Filled 2013-09-09: qty 2

## 2013-09-09 MED ORDER — SODIUM CHLORIDE 0.9 % IV BOLUS (SEPSIS)
1000.0000 mL | Freq: Once | INTRAVENOUS | Status: AC
  Administered 2013-09-09: 1000 mL via INTRAVENOUS

## 2013-09-09 MED ORDER — MAGNESIUM OXIDE 420 MG PO TABS: 420.0000 mg | ORAL_TABLET | Freq: Two times a day (BID) | ORAL | Status: DC

## 2013-09-09 MED ORDER — FERROUS SULFATE 325 (65 FE) MG PO TBEC
325.0000 mg | DELAYED_RELEASE_TABLET | Freq: Two times a day (BID) | ORAL | Status: DC
  Administered 2013-09-09 – 2013-09-16 (×15): 325 mg via ORAL
  Filled 2013-09-09 (×18): qty 1

## 2013-09-09 MED ORDER — ZOLPIDEM TARTRATE 5 MG PO TABS: 10.0000 mg | ORAL_TABLET | Freq: Every evening | ORAL | Status: DC | PRN

## 2013-09-09 MED ORDER — LORATADINE 10 MG PO TABS
10.0000 mg | ORAL_TABLET | Freq: Every day | ORAL | Status: DC
  Administered 2013-09-09 – 2013-09-16 (×8): 10 mg via ORAL
  Filled 2013-09-09 (×9): qty 1

## 2013-09-09 MED ORDER — RISPERIDONE 1 MG PO TABS
1.0000 mg | ORAL_TABLET | Freq: Two times a day (BID) | ORAL | Status: DC
  Administered 2013-09-09 – 2013-09-16 (×15): 1 mg via ORAL
  Filled 2013-09-09 (×16): qty 1

## 2013-09-09 MED ORDER — OXYCODONE HCL 5 MG PO TABS
5.0000 mg | ORAL_TABLET | ORAL | Status: DC | PRN
  Administered 2013-09-10 – 2013-09-16 (×26): 10 mg via ORAL
  Filled 2013-09-09 (×26): qty 2

## 2013-09-09 MED ORDER — MIRTAZAPINE 15 MG PO TABS
15.0000 mg | ORAL_TABLET | Freq: Every day | ORAL | Status: DC
  Administered 2013-09-09 – 2013-09-15 (×7): 15 mg via ORAL
  Filled 2013-09-09 (×8): qty 1

## 2013-09-09 MED ORDER — ENSURE COMPLETE PO LIQD
237.0000 mL | Freq: Two times a day (BID) | ORAL | Status: DC
  Administered 2013-09-09: 237 mL via ORAL

## 2013-09-09 MED ORDER — PRAZOSIN HCL 1 MG PO CAPS
1.0000 mg | ORAL_CAPSULE | Freq: Every day | ORAL | Status: DC
  Administered 2013-09-09 – 2013-09-15 (×7): 1 mg via ORAL
  Filled 2013-09-09 (×8): qty 1

## 2013-09-09 MED ORDER — PANTOPRAZOLE SODIUM 40 MG PO TBEC: 40.0000 mg | DELAYED_RELEASE_TABLET | Freq: Every day | ORAL | Status: DC

## 2013-09-09 MED ORDER — MAGNESIUM OXIDE 400 (241.3 MG) MG PO TABS
400.0000 mg | ORAL_TABLET | Freq: Two times a day (BID) | ORAL | Status: DC
  Administered 2013-09-09 – 2013-09-16 (×15): 400 mg via ORAL
  Filled 2013-09-09 (×17): qty 1

## 2013-09-09 MED ORDER — PANTOPRAZOLE SODIUM 40 MG PO TBEC
40.0000 mg | DELAYED_RELEASE_TABLET | Freq: Every day | ORAL | Status: DC
  Administered 2013-09-10 – 2013-09-12 (×3): 40 mg via ORAL
  Filled 2013-09-09 (×3): qty 1
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
## 2013-09-09 NOTE — Progress Notes (Signed)
UR completed. Await medical stability for d/c.  Therapies recommending CIR.  Carlyle LipaMichelle Shiloh Southern, RN BSN MHA CCM Trauma/Neuro ICU Case Manager [phone]

## 2013-09-09 NOTE — Progress Notes (Signed)
SLP Cancellation Note  Patient Details Name: Lorra HalsCharles Woodcox MRN: [id number] DOB: [birth year]   Cancelled treatment:        RN reports that pt's BP has dropped to the 70's.  Will defer SLP treatment today.     [name], Kassidie Hendriks T 09/09/2013, 2:57 PM

## 2013-09-09 NOTE — Progress Notes (Signed)
Pulling 1250 on IS. Up in chair. Feels he is swallowing much better. Will add oral pain ,eds and try to transition off PCA by tomorrow. I also spoke to his family. Patient examined and I agree with the assessment and plan  Violeta GelinasBurke Michell Giuliano, MD, MPH, FACS Pager: [phone]  09/09/2013 1:03 PM
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
## 2013-09-09 NOTE — Progress Notes (Signed)
SBP low 70's Patient is sleepy but arousable. MD made aware. Will continue to monitor.

## 2013-09-09 NOTE — Progress Notes (Signed)
NUTRITION FOLLOW UP  Intervention:   Provide Ensure Complete BID Provide Ensure Pudding once daily RD to continue to monitor  Nutrition Dx:   Inadequate oral intake related to inability to eat as evidenced by NPO status; discontinued, pt extubated  New Nutrition Dx: Inadequate oral intake related to dysphagia as evidenced by poor PO intake per pt's mother's report.  Goal:   Pt to meet >/= 90% of their estimated nutrition needs   Monitor:   Weight trends, labs, PO intake  Assessment:   Pt was extubated 1/2. Pt was NPO or on clear liquids since 12/30. Diet advanced to clear liquid 1/5 and full liquids today. Pt asleep at time of visit. Discussed pt with pt's mother at bedside who reports pt is eating very little due to swallowing difficulty; pt ate a few bites of custard and several bites of soup for lunch.  Per SLP note, when okay to advance to solids, recommend proceeding with caution to dysphagia 1.  No new weights.  Labs: Low sodium, high triglycerides  Height: Ht Readings from Last 1 Encounters:  09/02/13 5\' 8"  (1.727 m)    Weight Status:   Wt Readings from Last 1 Encounters:  09/02/13 212 lb 4.9 oz (96.3 kg)    Re-estimated needs:  Kcal: 2100-2300 Protein: 100-115 grams Fluid: 2.6 L/day  Skin: laceration above right eye; laceration on left knee  Diet Order: Full Liquid   Intake/Output Summary (Last 24 hours) at 09/09/13 1541 Last data filed at 09/09/13 0700  Gross per 24 hour  Intake   1000 ml  Output    775 ml  Net    225 ml    Last BM: 12/31   Labs:   Recent Labs Lab 09/06/13 0415 09/07/13 0335 09/09/13 0845  NA 138 134* 136*  K 3.3* 3.6* 3.7  CL 100 99 97  CO2 23 19 22   BUN 8 9 10   CREATININE 0.47* 0.37* 0.44*  CALCIUM 8.5 8.6 8.7  GLUCOSE 117* 112* 116*    CBG (last 3)   Recent Labs  09/09/13 0740 09/09/13 1134 09/09/13 1528  GLUCAP 112* 165* 121*    Scheduled Meds: . antiseptic oral rinse  15 mL Mouth Rinse QID  .  chlorhexidine  15 mL Mouth Rinse BID  . ferrous sulfate  325 mg Oral BID  . gabapentin  300 mg Oral QID  . HYDROmorphone PCA 0.3 mg/mL   Intravenous Q4H  . insulin aspart  0-9 Units Subcutaneous Q4H  . lisinopril  10 mg Oral Daily  . loratadine  10 mg Oral Daily  . magnesium oxide  400 mg Oral BID  . mirtazapine  15 mg Oral QHS  . [START ON 09/10/2013] pantoprazole  40 mg Oral Daily  . potassium chloride  20 mEq Oral BID  . prazosin  1 mg Oral QHS  . risperiDONE  1 mg Oral BID    Continuous Infusions: . sodium chloride 100 mL/hr at 09/09/13 1535    Ian Malkineanne Barnett RD, LDN Inpatient Clinical Dietitian Pager: [phone] After Hours Pager: [phone]

## 2013-09-09 NOTE — Progress Notes (Signed)
Patient ID: Brian Cisneros, male   DOB: 07-21-1961, 53 y.o.   MRN: [id number] Has received partial bolus for SBP 79. Back in bed and feeling better. BP 97/66. Will follow closely. Violeta GelinasBurke Antoin Dargis, MD, MPH, FACS Pager: [phone]

## 2013-09-10 ENCOUNTER — Inpatient Hospital Stay (HOSPITAL_COMMUNITY): Payer: Non-veteran care

## 2013-09-10 DIAGNOSIS — E876 Hypokalemia: Secondary | ICD-10-CM

## 2013-09-10 LAB — BASIC METABOLIC PANEL
BUN: 8 mg/dL (ref 6–23)
CALCIUM: 8.5 mg/dL (ref 8.4–10.5)
CO2: 20 mEq/L (ref 19–32)
Chloride: 102 mEq/L (ref 96–112)
Creatinine, Ser: 0.46 mg/dL — ABNORMAL LOW (ref 0.50–1.35)
GFR calc Af Amer: >90 mL/min (ref >90)
Glucose, Bld: 109 mg/dL — ABNORMAL HIGH (ref 70–99)
Potassium: 3.5 mEq/L — ABNORMAL LOW (ref 3.7–5.3)
Sodium: 137 mEq/L (ref 137–147)

## 2013-09-10 LAB — GLUCOSE, CAPILLARY
GLUCOSE-CAPILLARY: 113 mg/dL — AB (ref 70–99)
GLUCOSE-CAPILLARY: 114 mg/dL — AB (ref 70–99)
Glucose-Capillary: 111 mg/dL — ABNORMAL HIGH (ref 70–99)
Glucose-Capillary: 114 mg/dL — ABNORMAL HIGH (ref 70–99)
Glucose-Capillary: 115 mg/dL — ABNORMAL HIGH (ref 70–99)
Glucose-Capillary: 158 mg/dL — ABNORMAL HIGH (ref 70–99)

## 2013-09-10 MED ORDER — FLEET ENEMA 7-19 GM/118ML RE ENEM
1.0000 | ENEMA | Freq: Once | RECTAL | Status: AC
Start: 2013-09-10 — End: 2013-09-10
  Administered 2013-09-10: 17:00:00 via RECTAL
  Filled 2013-09-10 (×3): qty 1

## 2013-09-10 MED ORDER — HYDROMORPHONE HCL PF 1 MG/ML IJ SOLN
0.5000 mg | INTRAMUSCULAR | Status: DC | PRN
Start: 2013-09-10 — End: 2013-09-12
  Administered 2013-09-10 – 2013-09-11 (×5): 1 mg via INTRAVENOUS
  Filled 2013-09-10 (×5): qty 1

## 2013-09-10 MED ORDER — POTASSIUM CHLORIDE IN NACL 20-0.9 MEQ/L-% IV SOLN
INTRAVENOUS | Status: DC
  Administered 2013-09-10 – 2013-09-12 (×3): via INTRAVENOUS
  Filled 2013-09-10 (×5): qty 1000

## 2013-09-10 MED ORDER — DOCUSATE SODIUM 100 MG PO CAPS
100.0000 mg | ORAL_CAPSULE | Freq: Two times a day (BID) | ORAL | Status: DC
  Administered 2013-09-10 – 2013-09-16 (×13): 100 mg via ORAL
  Filled 2013-09-10 (×12): qty 1
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
## 2013-09-10 NOTE — Progress Notes (Signed)
Rehab admissions - I met with patient.  He tells me that he has [employer].  [facility] will not authorize an acute inpatient rehab stay.  Patient would have to pay out of pocket for inpatient rehab.  He wishes to contact his congressman and speak with him about paying for inpatient rehab.  Patient wishes me to hold on inpatient rehab for now.  Call me for questions.  #[phone]
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
## 2013-09-10 NOTE — Progress Notes (Addendum)
I had [facility] yesterday to make sure they had notification of pt's admission.  I inquired about pt's inpatient rehab being covered and was advised by April Alexandar in the transfer coordinator office that in spite of pt's 70% service connection, he would only be able to attend inpatient [facility] at [facility] if it was paid for privately.  April transferred me to Mechele CollinSara Kennedy to begin short term rehab (SNF) process.  Huntley DecSara stated that pt would need to transfer to their short term rehab facilities in order for the VA to pay for it.  She emailed all necessary forms to me to facilitate that process.  She indicated that the forms and documents all need to be faxed to [facility] screening committee [phone].  If received by 11am, they are reviewed the same day.   I have completed the 5-page form with pt and pt's mother, however, mother denied permission to fax it in to the [facility] at this time.  She wishes to speak with pt's sister about the pt's options before proceeding with that.  She stated that the patient's sister stated last time he was there that she didn't want the patient to go back to the [facility] facility again.  I expressed to mother and pt that he was within about 48hrs of being ready to move to the next level of care.  I gave her the options of [facility] as a self pay, to a VA facility with the VA covering the stay, or taking the patient home and having HH services arranged and mother/sister providing necessary assistance. Mother stated that she understood the choices and took my phone number.  She said she would speak with the patient's sister and would call me in the morning (09/11/2012) with their decision.    CSW is aware of all the details of this process so far and will continue the placement process if that is the wish of the patient after they have had a chance to discuss options.

## 2013-09-10 NOTE — Progress Notes (Signed)
Patient ID: Brian HalsCharles Janowiak, male   DOB: May 28, 1961, 53 y.o.   MRN: [id number] Pt seen today. States back is "better." In TLSO. Denies leg symptoms. Xrays reviewed. There has been progression of the fractures with moderate loss of vertebral body height. I think these may heal fine in the brace and we should be able to avoid surgery. Following.

## 2013-09-10 NOTE — Progress Notes (Signed)
Patient sitting up in chair, passing flatus, no bowel movement.  Looks comfortable.  CIR may be a problem because of his insurance coverage.  This patient has been seen and I agree with the findings and treatment plan.  Marta LamasJames O. Gae BonWyatt, III, MD, FACS [phone] (pager) [phone] (direct pager) Trauma Surgeon

## 2013-09-11 ENCOUNTER — Inpatient Hospital Stay (HOSPITAL_COMMUNITY): Payer: Non-veteran care

## 2013-09-11 DIAGNOSIS — I10 Essential (primary) hypertension: Secondary | ICD-10-CM

## 2013-09-11 LAB — GLUCOSE, CAPILLARY
GLUCOSE-CAPILLARY: 123 mg/dL — AB (ref 70–99)
Glucose-Capillary: 109 mg/dL — ABNORMAL HIGH (ref 70–99)
Glucose-Capillary: 128 mg/dL — ABNORMAL HIGH (ref 70–99)
Glucose-Capillary: 111 mg/dL — ABNORMAL HIGH (ref 70–99)
Glucose-Capillary: 140 mg/dL — ABNORMAL HIGH (ref 70–99)
Glucose-Capillary: 107 mg/dL — ABNORMAL HIGH (ref 70–99)
Glucose-Capillary: 142 mg/dL — ABNORMAL HIGH (ref 70–99)

## 2013-09-11 MED ORDER — ENSURE COMPLETE PO LIQD
237.0000 mL | Freq: Three times a day (TID) | ORAL | Status: DC
  Administered 2013-09-11 – 2013-09-15 (×11): 237 mL via ORAL

## 2013-09-11 MED ORDER — TUBERCULIN PPD 5 UNIT/0.1ML ID SOLN
5.0000 [IU] | Freq: Once | INTRADERMAL | Status: AC
  Administered 2013-09-11: 5 [IU] via INTRADERMAL
  Filled 2013-09-11: qty 0.1

## 2013-09-11 MED ORDER — METHOCARBAMOL 500 MG PO TABS
1000.0000 mg | ORAL_TABLET | Freq: Three times a day (TID) | ORAL | Status: DC
  Administered 2013-09-11 – 2013-09-14 (×12): 1000 mg via ORAL
  Filled 2013-09-11 (×16): qty 2

## 2013-09-11 MED ORDER — RESOURCE THICKENUP CLEAR PO POWD
ORAL | Status: DC | PRN
  Filled 2013-09-11: qty 125

## 2013-09-11 MED ORDER — POTASSIUM CHLORIDE 20 MEQ/15ML (10%) PO LIQD
20.0000 meq | Freq: Once | ORAL | Status: DC
  Filled 2013-09-11: qty 15

## 2013-09-11 MED ORDER — TRAMADOL HCL 50 MG PO TABS
50.0000 mg | ORAL_TABLET | Freq: Four times a day (QID) | ORAL | Status: DC | PRN
  Administered 2013-09-11: 100 mg via ORAL
  Administered 2013-09-12: 50 mg via ORAL
  Administered 2013-09-13 – 2013-09-16 (×4): 100 mg via ORAL
  Filled 2013-09-11 (×7): qty 2
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
## 2013-09-11 NOTE — Clinical Social Work Placement (Addendum)
Clinical Social Work Department CLINICAL SOCIAL WORK PLACEMENT NOTE 09/11/2013  Patient:  Brian Cisneros,Brian Cisneros  Account Number:  [id number] Admit date:  09/02/2013  Clinical Social Worker:  Macario GoldsJESSE Lyda Colcord, LCSW  Date/time:  09/11/2013 04:25 PM  Clinical Social Work is seeking post-discharge placement for this patient at the following level of care:   SKILLED NURSING   (*CSW will update this form in Epic as items are completed)   09/11/2013  Patient/family provided with [facility] Department of Clinical Social Work's list of facilities offering this level of care within the geographic area requested by the patient (or if unable, by the patient's family).  09/11/2013  Patient/family informed of their freedom to choose among providers that offer the needed level of care, that participate in Medicare, Medicaid or managed care program needed by the patient, have an available bed and are willing to accept the patient.  09/11/2013  Patient/family informed of [facility]' ownership interest in [facility], as well as of the fact that they are under no obligation to receive care at this facility.  PASARR submitted to EDS on 09/10/2013 PASARR number received from EDS on 09/12/2013  FL2 transmitted to all facilities in geographic area requested by pt/family on  09/11/2013 FL2 transmitted to all facilities within larger geographic area on   Patient informed that his/her managed care company has contracts with or will negotiate with  certain facilities, including the following:     Patient/family informed of bed offers received:  09/15/2013 Patient chooses bed at  [facility] Physician recommends and patient chooses bed at     Patient to be transferred to [facility] on  09/15/2013 Patient to be transferred to facility by  [facility]  The following physician request were entered in Epic:   Additional Comments: 01/08 [facility]  [facility] facility placement
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
## 2013-09-11 NOTE — Clinical Social Work Note (Signed)
Clinical Social Worker continuing to follow for support and discharge planning needs.  CSW and CM spoke with patient mother over the phone this morning who was agreeable to [place] [place] to [place] for potential [place].  Packet of requested information sent [facility] at 10:45am and return call from [facility] at 14:30 to state that patient has been approved for SNF placement at a contract facility.  CSW spoke with patient at bedside to update on available contract facilities.  [place].  CSW has initiated search to all VA contract facilities in hopes of bed availability.  CSW to follow up with patient and patient mother regarding potential bed offers and time frame for discharge needs.  CSW remains available for support and to facilitate patient discharge needs once medically stable.  Brian Cisneros, [place] [phone]

## 2013-09-11 NOTE — Progress Notes (Signed)
PT Cancellation Note  Patient Details Name: Lorra HalsCharles Kleckner MRN: [id number] DOB: February 16, 1961   Cancelled Treatment:    Reason Eval/Treat Not Completed: Patient at procedure or test/unavailable. Patient receiving a FEES at this time by SLP. Will follow up in AM   Marcellus Pulliam, Adline PotterJulia Elizabeth 09/11/2013, 2:02 PM
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
## 2013-09-11 NOTE — Progress Notes (Signed)
Patient dong a lot better.  Abdomen much softer.  Nontender.  Ileus resolving.  Needs PPD documented.  This patient has been seen and I agree with the findings and treatment plan.  Marta LamasJames O. Gae BonWyatt, III, MD, FACS [phone] (pager) [phone] (direct pager) Trauma Surgeon

## 2013-09-11 NOTE — Progress Notes (Signed)
PT Cancellation Note  Patient Details Name: Brian Cisneros MRN: [id number] DOB: 1960/11/01   Cancelled Treatment:    Reason Eval/Treat Not Completed: Patient declined, no reason specified. Attempted to see patient this morning. On first attempt patient eating breakfast and requested PTA to come back. When returned back patient returning to bed from bathroom and declined PT at that time. Will attempt to check patient on patient this afternoon as time allows.    Fredrich BirksRobinette, Julia Elizabeth 09/11/2013, 9:11 AM
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
## 2013-09-12 LAB — GLUCOSE, CAPILLARY
GLUCOSE-CAPILLARY: 114 mg/dL — AB (ref 70–99)
GLUCOSE-CAPILLARY: 128 mg/dL — AB (ref 70–99)
Glucose-Capillary: 103 mg/dL — ABNORMAL HIGH (ref 70–99)
Glucose-Capillary: 113 mg/dL — ABNORMAL HIGH (ref 70–99)
Glucose-Capillary: 117 mg/dL — ABNORMAL HIGH (ref 70–99)
Glucose-Capillary: 126 mg/dL — ABNORMAL HIGH (ref 70–99)

## 2013-09-12 MED ORDER — PANTOPRAZOLE SODIUM 40 MG PO TBEC
40.0000 mg | DELAYED_RELEASE_TABLET | Freq: Two times a day (BID) | ORAL | Status: DC
  Administered 2013-09-12 – 2013-09-16 (×8): 40 mg via ORAL
  Filled 2013-09-12 (×8): qty 1

## 2013-09-12 MED ORDER — NAPROXEN 500 MG PO TABS
500.0000 mg | ORAL_TABLET | Freq: Two times a day (BID) | ORAL | Status: DC
  Administered 2013-09-12 – 2013-09-16 (×8): 500 mg via ORAL
  Filled 2013-09-12 (×12): qty 1
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
## 2013-09-12 NOTE — Progress Notes (Signed)
Will have ENT evaluate this patient.  This patient has been seen and I agree with the findings and treatment plan.  Marta LamasJames O. Gae BonWyatt, III, MD, FACS [phone] (pager) [phone] (direct pager) Trauma Surgeon

## 2013-09-12 NOTE — Clinical Social Work Note (Signed)
Clinical Social Worker continuing to follow patient and family for support and discharge planning needs.  [facility] regarding [facility] availability.  CSW [facility] (unable to meet patient medical needs), [facility] [place] (no [place] beds), [facility] (no bed availability per admissions coordinator), awaiting response from [facility] and [facility].  These are the only 6 facilities [facility] with the [facility] in the [place].  [place] [facility] [place] from last two facilities.  CSW remains available for support and to facilitate patient discharge needs once medically ready and bed available.  Macario GoldsJesse Erving Sassano, [place]CS[place] [phone]
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
## 2013-09-12 NOTE — Procedures (Signed)
Preop diagnosis: Dysphonia, dysphagia, post extubation Postop diagnosis: same Procedure: Transnasal fiberoptic laryngoscopy Surgeon: Jenne PaneBates Anesth: None Compl: None Findings: There is purulent fluid in the nasopharynx with inflammation of the adenoid region.  There is mild thickening of the epiglottis.  There is mild edema of the posterior supraglottis.  Vocal folds are symmetrically mobile with good abduction but sluggish adduction and poor glottic closure.  There is inflammation and ulceration of the posterior glottis and subglottis. Description: After discussing risks, benefits, and alternatives, the fiberoptic laryngoscope was passed through the right nasal passage to view the pharynx and larynx.  Findings are noted above.  After completion, the scope was removed and the patient was returned to nursing care in stable condition.
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
## 2013-09-13 LAB — GLUCOSE, CAPILLARY
GLUCOSE-CAPILLARY: 112 mg/dL — AB (ref 70–99)
GLUCOSE-CAPILLARY: 114 mg/dL — AB (ref 70–99)
GLUCOSE-CAPILLARY: 118 mg/dL — AB (ref 70–99)
Glucose-Capillary: 109 mg/dL — ABNORMAL HIGH (ref 70–99)
Glucose-Capillary: 108 mg/dL — ABNORMAL HIGH (ref 70–99)
Glucose-Capillary: 108 mg/dL — ABNORMAL HIGH (ref 70–99)
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
## 2013-09-14 LAB — GLUCOSE, CAPILLARY
GLUCOSE-CAPILLARY: 142 mg/dL — AB (ref 70–99)
Glucose-Capillary: 106 mg/dL — ABNORMAL HIGH (ref 70–99)
Glucose-Capillary: 113 mg/dL — ABNORMAL HIGH (ref 70–99)
Glucose-Capillary: 119 mg/dL — ABNORMAL HIGH (ref 70–99)
Glucose-Capillary: 125 mg/dL — ABNORMAL HIGH (ref 70–99)
Glucose-Capillary: 132 mg/dL — ABNORMAL HIGH (ref 70–99)
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
## 2013-09-15 DIAGNOSIS — R490 Dysphonia: Secondary | ICD-10-CM | POA: Clinically undetermined

## 2013-09-15 DIAGNOSIS — R1310 Dysphagia, unspecified: Secondary | ICD-10-CM | POA: Clinically undetermined

## 2013-09-15 LAB — GLUCOSE, CAPILLARY
GLUCOSE-CAPILLARY: 106 mg/dL — AB (ref 70–99)
GLUCOSE-CAPILLARY: 109 mg/dL — AB (ref 70–99)
GLUCOSE-CAPILLARY: 115 mg/dL — AB (ref 70–99)
GLUCOSE-CAPILLARY: 129 mg/dL — AB (ref 70–99)
Glucose-Capillary: 94 mg/dL (ref 70–99)
Glucose-Capillary: 127 mg/dL — ABNORMAL HIGH (ref 70–99)

## 2013-09-15 MED ORDER — LISINOPRIL 20 MG PO TABS: 10.0000 mg | ORAL_TABLET | Freq: Every day | ORAL | Status: AC

## 2013-09-15 MED ORDER — OXYCODONE HCL 5 MG PO TABS: 5.0000 mg | ORAL_TABLET | ORAL | Status: DC | PRN

## 2013-09-15 MED ORDER — RISPERIDONE 1 MG PO TABS: 1.0000 mg | ORAL_TABLET | Freq: Two times a day (BID) | ORAL | Status: DC

## 2013-09-15 MED ORDER — METHOCARBAMOL 500 MG PO TABS: 1000.0000 mg | ORAL_TABLET | Freq: Three times a day (TID) | ORAL | Status: DC | PRN

## 2013-09-15 MED ORDER — TRAMADOL HCL 50 MG PO TABS: 50.0000 mg | ORAL_TABLET | Freq: Four times a day (QID) | ORAL | Status: DC | PRN

## 2013-09-15 MED ORDER — RESOURCE THICKENUP CLEAR PO POWD: 1.0000 | ORAL | Status: DC | PRN

## 2013-09-15 MED ORDER — ZOLPIDEM TARTRATE 10 MG PO TABS: 10.0000 mg | ORAL_TABLET | Freq: Every evening | ORAL | Status: DC | PRN

## 2013-09-15 NOTE — Clinical Social Work Note (Signed)
Clinical Social Worker continuing to follow patient and family for support and discharge planning needs.  CSW met with patient at bedside regarding bed offer at [facility] in [place], [place].  Patient has accepted and agreed to transport today.  CSW notified patient mother of his discharge plans over the phone per patient request.  Clinical Social Worker facilitated patient discharge including contacting patient family and facility to confirm patient discharge plans.  Clinical information faxed to facility and family agreeable with plan.  CSW arranged ambulance transport via [facility] ([place] arranged transport) to [facility] in [place], [place] .  RN to call report prior to discharge.  CSW will sign off for now as social work intervention is no longer needed. Please consult [place] again if new need arises.  Barbette Or, [place]
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
## 2013-09-15 NOTE — Progress Notes (Signed)
Ambulating with therapies. Placement in [facility] SNF pending. Patient examined and I agree with the assessment and plan  Violeta GelinasBurke Zakhai Meisinger, MD, MPH, FACS Pager: [phone]  09/15/2013 12:00 PM
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
## 2013-09-15 NOTE — Discharge Summary (Signed)
Brian Diliberto, MD, MPH, FACS Pager: [phone]  
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
## 2013-09-16 DIAGNOSIS — R1310 Dysphagia, unspecified: Secondary | ICD-10-CM

## 2013-09-16 DIAGNOSIS — S2249XA Multiple fractures of ribs, unspecified side, initial encounter for closed fracture: Secondary | ICD-10-CM

## 2013-09-16 DIAGNOSIS — S32009A Unspecified fracture of unspecified lumbar vertebra, initial encounter for closed fracture: Secondary | ICD-10-CM

## 2013-09-16 LAB — GLUCOSE, CAPILLARY
GLUCOSE-CAPILLARY: 100 mg/dL — AB (ref 70–99)
Glucose-Capillary: 98 mg/dL (ref 70–99)
Glucose-Capillary: 96 mg/dL (ref 70–99)
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
## 2013-09-16 NOTE — Clinical Social Work Note (Signed)
Clinical Social Worker received notification that patient was never picked up by transport for discharge to SNF.  CSW [facility] who was not aware that patient was still hospitalized.  Brian Cisneros was able to get in touch with with [facility] transport [facility] who made arrangements to pick patient up at 10:15 this morning.  [facility] with the [facility] plans to look into the issue as to why patient did not discharge overnight.    CSW notified [facility] about patient delay - they are willing to accept patient this morning.  CSW arranged ambulance transport [facility].  RN to call report prior to discharge.  Clinical Social Worker will sign off for now as social work intervention is no longer needed. Please consult us again if new need arises.  Brian Cisneros, [place] [phone]

## 2013-09-16 NOTE — Progress Notes (Signed)
Report called to Claris CheMargaret, RN at [facility]. Pt discharged via [facility] to [facility]. Malen Gauzearol Keana Dueitt, RN

## 2013-09-16 NOTE — Discharge Summary (Signed)
Gae Bihl, MD, MPH, FACS Pager: [phone]  

## 2014-03-29 ENCOUNTER — Encounter (HOSPITAL_COMMUNITY)
Admission: EM | Disposition: A | Discharge status: Home / Self Care | Payer: Self-pay | Source: Home / Self Care | Attending: Urology

## 2014-03-29 ENCOUNTER — Emergency Department (HOSPITAL_COMMUNITY): Payer: Non-veteran care

## 2014-03-29 ENCOUNTER — Inpatient Hospital Stay (HOSPITAL_COMMUNITY)
Admission: EM | Admit: 2014-03-29 | Discharge: 2014-03-29 | DRG: 694 | Disposition: A | Discharge status: Home / Self Care | Payer: Non-veteran care | Attending: Urology | Admitting: Urology

## 2014-03-29 ENCOUNTER — Encounter (HOSPITAL_COMMUNITY): Payer: Non-veteran care | Admitting: Anesthesiology

## 2014-03-29 ENCOUNTER — Emergency Department (HOSPITAL_COMMUNITY): Payer: Non-veteran care | Admitting: Anesthesiology

## 2014-03-29 ENCOUNTER — Encounter (HOSPITAL_COMMUNITY): Payer: Self-pay | Admitting: Emergency Medicine

## 2014-03-29 DIAGNOSIS — F17200 Nicotine dependence, unspecified, uncomplicated: Secondary | ICD-10-CM | POA: Diagnosis present

## 2014-03-29 DIAGNOSIS — R310 Gross hematuria: Secondary | ICD-10-CM | POA: Diagnosis present

## 2014-03-29 DIAGNOSIS — N201 Calculus of ureter: Principal | ICD-10-CM

## 2014-03-29 DIAGNOSIS — N139 Obstructive and reflux uropathy, unspecified: Secondary | ICD-10-CM

## 2014-03-29 DIAGNOSIS — F4310 Post-traumatic stress disorder, unspecified: Secondary | ICD-10-CM | POA: Diagnosis present

## 2014-03-29 DIAGNOSIS — F3289 Other specified depressive episodes: Secondary | ICD-10-CM | POA: Diagnosis present

## 2014-03-29 DIAGNOSIS — N200 Calculus of kidney: Secondary | ICD-10-CM | POA: Diagnosis present

## 2014-03-29 DIAGNOSIS — N401 Enlarged prostate with lower urinary tract symptoms: Secondary | ICD-10-CM

## 2014-03-29 DIAGNOSIS — F329 Major depressive disorder, single episode, unspecified: Secondary | ICD-10-CM | POA: Diagnosis present

## 2014-03-29 DIAGNOSIS — N138 Other obstructive and reflux uropathy: Secondary | ICD-10-CM | POA: Diagnosis present

## 2014-03-29 DIAGNOSIS — N209 Urinary calculus, unspecified: Secondary | ICD-10-CM | POA: Diagnosis present

## 2014-03-29 DIAGNOSIS — Z79899 Other long term (current) drug therapy: Secondary | ICD-10-CM

## 2014-03-29 DIAGNOSIS — Z791 Long term (current) use of non-steroidal anti-inflammatories (NSAID): Secondary | ICD-10-CM

## 2014-03-29 DIAGNOSIS — I10 Essential (primary) hypertension: Secondary | ICD-10-CM | POA: Diagnosis present

## 2014-03-29 DIAGNOSIS — N1330 Unspecified hydronephrosis: Secondary | ICD-10-CM

## 2014-03-29 HISTORY — PX: CYSTOSCOPY/RETROGRADE/URETEROSCOPY/STONE EXTRACTION WITH BASKET: SHX5317

## 2014-03-29 LAB — COMPREHENSIVE METABOLIC PANEL
ALT: 14 U/L (ref 0–53)
ANION GAP: 12 (ref 5–15)
AST: 15 U/L (ref 0–37)
Albumin: 3.1 g/dL — ABNORMAL LOW (ref 3.5–5.2)
Alkaline Phosphatase: 97 U/L (ref 39–117)
BUN: 9 mg/dL (ref 6–23)
CALCIUM: 9 mg/dL (ref 8.4–10.5)
CHLORIDE: 105 meq/L (ref 96–112)
CO2: 20 mEq/L (ref 19–32)
CREATININE: 0.66 mg/dL (ref 0.50–1.35)
GFR calc Af Amer: >90 mL/min (ref >90)
GFR calc non Af Amer: >90 mL/min (ref >90)
Glucose, Bld: 144 mg/dL — ABNORMAL HIGH (ref 70–99)
Potassium: 3 mEq/L — ABNORMAL LOW (ref 3.7–5.3)
Sodium: 137 mEq/L (ref 137–147)
Total Protein: 6.3 g/dL (ref 6.0–8.3)

## 2014-03-29 LAB — URINE MICROSCOPIC-ADD ON

## 2014-03-29 LAB — URINALYSIS, ROUTINE W REFLEX MICROSCOPIC
Bilirubin Urine: NEGATIVE
GLUCOSE, UA: NEGATIVE mg/dL
KETONES UR: NEGATIVE mg/dL
NITRITE: NEGATIVE
Protein, ur: 30 mg/dL — AB
Specific Gravity, Urine: 1.023 (ref 1.005–1.030)
Urobilinogen, UA: 0.2 mg/dL (ref 0.0–1.0)
pH: 6.5 (ref 5.0–8.0)

## 2014-03-29 LAB — CBC
HEMATOCRIT: 30.3 % — AB (ref 39.0–52.0)
Hemoglobin: 9.8 g/dL — ABNORMAL LOW (ref 13.0–17.0)
MCH: 23.3 pg — AB (ref 26.0–34.0)
MCHC: 32.3 g/dL (ref 30.0–36.0)
MCV: 72 fL — AB (ref 78.0–100.0)
PLATELETS: 328 10*3/uL (ref 150–400)
RBC: 4.21 MIL/uL — ABNORMAL LOW (ref 4.22–5.81)
RDW: 16.2 % — AB (ref 11.5–15.5)
WBC: 6.6 10*3/uL (ref 4.0–10.5)

## 2014-03-29 LAB — LIPASE, BLOOD: Lipase: 50 U/L (ref 11–59)

## 2014-03-29 SURGERY — CYSTOSCOPY, WITH CALCULUS REMOVAL USING BASKET
Anesthesia: General | Site: Ureter | Laterality: Right

## 2014-03-29 MED ORDER — LIDOCAINE HCL 2 % EX GEL
CUTANEOUS | Status: AC
  Filled 2014-03-29: qty 10

## 2014-03-29 MED ORDER — FENTANYL CITRATE 0.05 MG/ML IJ SOLN
INTRAMUSCULAR | Status: AC
  Filled 2014-03-29: qty 2

## 2014-03-29 MED ORDER — KETOROLAC TROMETHAMINE 30 MG/ML IJ SOLN
INTRAMUSCULAR | Status: AC
  Filled 2014-03-29: qty 1

## 2014-03-29 MED ORDER — SODIUM CHLORIDE 0.9 % IV SOLN
INTRAVENOUS | Status: DC | PRN
  Administered 2014-03-29: 10:00:00 via INTRAVENOUS

## 2014-03-29 MED ORDER — HYDROCODONE-ACETAMINOPHEN 5-325 MG PO TABS
1.0000 | ORAL_TABLET | Freq: Once | ORAL | Status: AC | PRN
  Administered 2014-03-29: 2 via ORAL

## 2014-03-29 MED ORDER — PROMETHAZINE HCL 25 MG/ML IJ SOLN: 6.2500 mg | INTRAMUSCULAR | Status: DC | PRN

## 2014-03-29 MED ORDER — MEPERIDINE HCL 50 MG/ML IJ SOLN: 6.2500 mg | INTRAMUSCULAR | Status: DC | PRN

## 2014-03-29 MED ORDER — FENTANYL CITRATE 0.05 MG/ML IJ SOLN: 25.0000 ug | INTRAMUSCULAR | Status: DC | PRN

## 2014-03-29 MED ORDER — BELLADONNA ALKALOIDS-OPIUM 16.2-60 MG RE SUPP
RECTAL | Status: AC
  Filled 2014-03-29: qty 1

## 2014-03-29 MED ORDER — CEPHALEXIN 500 MG PO CAPS
500.0000 mg | ORAL_CAPSULE | Freq: Once | ORAL | Status: AC
  Administered 2014-03-29: 500 mg via ORAL
  Filled 2014-03-29: qty 1

## 2014-03-29 MED ORDER — PROPOFOL 10 MG/ML IV BOLUS
INTRAVENOUS | Status: AC
  Filled 2014-03-29: qty 20

## 2014-03-29 MED ORDER — PHENAZOPYRIDINE HCL 200 MG PO TABS
200.0000 mg | ORAL_TABLET | Freq: Once | ORAL | Status: AC | PRN
Start: 2014-03-29 — End: 2014-03-29
  Administered 2014-03-29: 200 mg via ORAL

## 2014-03-29 MED ORDER — TAMSULOSIN HCL 0.4 MG PO CAPS
0.4000 mg | ORAL_CAPSULE | Freq: Every day | ORAL | Status: DC
  Filled 2014-03-29 (×2): qty 1

## 2014-03-29 MED ORDER — PHENAZOPYRIDINE HCL 200 MG PO TABS
ORAL_TABLET | ORAL | Status: AC
  Filled 2014-03-29: qty 1

## 2014-03-29 MED ORDER — SODIUM CHLORIDE 0.9 % IR SOLN
Status: DC | PRN
  Administered 2014-03-29: 1000 mL
  Administered 2014-03-29: 3000 mL

## 2014-03-29 MED ORDER — IOHEXOL 300 MG/ML  SOLN
INTRAMUSCULAR | Status: DC | PRN
  Administered 2014-03-29: 8 mL via INTRAVENOUS

## 2014-03-29 MED ORDER — LIDOCAINE HCL (CARDIAC) 20 MG/ML IV SOLN
INTRAVENOUS | Status: AC
  Filled 2014-03-29: qty 5

## 2014-03-29 MED ORDER — SODIUM CHLORIDE 0.9 % IV SOLN
80.0000 mg | Freq: Once | INTRAVENOUS | Status: AC
  Administered 2014-03-29: 06:00:00 80 mg via INTRAVENOUS
  Filled 2014-03-29: qty 80

## 2014-03-29 MED ORDER — LIDOCAINE HCL (CARDIAC) 20 MG/ML IV SOLN
INTRAVENOUS | Status: DC | PRN
  Administered 2014-03-29: 100 mg via INTRAVENOUS

## 2014-03-29 MED ORDER — CIPROFLOXACIN IN D5W 400 MG/200ML IV SOLN
400.0000 mg | Freq: Once | INTRAVENOUS | Status: AC
  Administered 2014-03-29: 400 mg via INTRAVENOUS

## 2014-03-29 MED ORDER — TAMSULOSIN HCL 0.4 MG PO CAPS: 0.4000 mg | ORAL_CAPSULE | Freq: Every day | ORAL | Status: AC

## 2014-03-29 MED ORDER — FENTANYL CITRATE 0.05 MG/ML IJ SOLN
50.0000 ug | Freq: Once | INTRAMUSCULAR | Status: AC
  Administered 2014-03-29: 50 ug via INTRAVENOUS
  Filled 2014-03-29: qty 2

## 2014-03-29 MED ORDER — HYDROCODONE-ACETAMINOPHEN 5-325 MG PO TABS: 1.0000 | ORAL_TABLET | Freq: Four times a day (QID) | ORAL | Status: DC | PRN

## 2014-03-29 MED ORDER — FENTANYL CITRATE 0.05 MG/ML IJ SOLN
INTRAMUSCULAR | Status: DC | PRN
  Administered 2014-03-29 (×4): 25 ug via INTRAVENOUS

## 2014-03-29 MED ORDER — ONDANSETRON HCL 4 MG/2ML IJ SOLN
4.0000 mg | Freq: Once | INTRAMUSCULAR | Status: AC
  Administered 2014-03-29: 4 mg via INTRAVENOUS
  Filled 2014-03-29: qty 2

## 2014-03-29 MED ORDER — KETOROLAC TROMETHAMINE 30 MG/ML IJ SOLN
INTRAMUSCULAR | Status: DC | PRN
  Administered 2014-03-29: 30 mg via INTRAVENOUS

## 2014-03-29 MED ORDER — CIPROFLOXACIN IN D5W 400 MG/200ML IV SOLN
INTRAVENOUS | Status: AC
  Filled 2014-03-29: qty 200

## 2014-03-29 MED ORDER — SODIUM CHLORIDE 0.9 % IV SOLN
Freq: Once | INTRAVENOUS | Status: AC
  Administered 2014-03-29: 08:00:00 via INTRAVENOUS

## 2014-03-29 MED ORDER — HYDROMORPHONE HCL PF 1 MG/ML IJ SOLN
1.0000 mg | Freq: Once | INTRAMUSCULAR | Status: AC
  Administered 2014-03-29: 1 mg via INTRAVENOUS
  Filled 2014-03-29: qty 1

## 2014-03-29 MED ORDER — HYDROCODONE-ACETAMINOPHEN 5-325 MG PO TABS
ORAL_TABLET | ORAL | Status: AC
  Filled 2014-03-29: qty 2

## 2014-03-29 MED ORDER — CIPROFLOXACIN HCL 500 MG PO TABS: 500.0000 mg | ORAL_TABLET | Freq: Two times a day (BID) | ORAL | Status: DC

## 2014-03-29 MED ORDER — MIDAZOLAM HCL 2 MG/2ML IJ SOLN: 0.5000 mg | Freq: Once | INTRAMUSCULAR | Status: DC | PRN

## 2014-03-29 MED ORDER — PROPOFOL 10 MG/ML IV BOLUS
INTRAVENOUS | Status: DC | PRN
  Administered 2014-03-29: 200 mg via INTRAVENOUS

## 2014-03-29 SURGICAL SUPPLY — 20 items
BAG URO CATCHER STRL LF (DRAPE) ×4 IMPLANT
BASKET STNLS GEMINI 4WIRE 3FR (BASKET) IMPLANT
BASKET ZERO TIP NITINOL 2.4FR (BASKET) ×4 IMPLANT
CATH INTERMIT  6FR 70CM (CATHETERS) ×4 IMPLANT
CLOTH BEACON ORANGE TIMEOUT ST (SAFETY) ×4 IMPLANT
DRAPE CAMERA CLOSED 9X96 (DRAPES) ×4 IMPLANT
GLOVE BIOGEL M STRL SZ7.5 (GLOVE) ×4 IMPLANT
GLOVE SURG SS PI 8.0 STRL IVOR (GLOVE) ×4 IMPLANT
GOWN STRL REUS W/ TWL XL LVL3 (GOWN DISPOSABLE) ×2 IMPLANT
GOWN STRL REUS W/TWL XL LVL3 (GOWN DISPOSABLE) ×6 IMPLANT
GUIDEWIRE STR DUAL SENSOR (WIRE) ×4 IMPLANT
IV NS 1000ML (IV SOLUTION) ×2
IV NS 1000ML BAXH (IV SOLUTION) ×2 IMPLANT
MANIFOLD NEPTUNE II (INSTRUMENTS) ×4 IMPLANT
MARKER SKIN DUAL TIP RULER LAB (MISCELLANEOUS) ×4 IMPLANT
PACK CYSTO (CUSTOM PROCEDURE TRAY) ×4 IMPLANT
STENT CONTOUR 6FRX24X.038 (STENTS) ×4 IMPLANT
TUBE FEEDING 8FR 16IN STR KANG (MISCELLANEOUS) ×4 IMPLANT
TUBING CONNECTING 10 (TUBING) ×3 IMPLANT
TUBING CONNECTING 10' (TUBING) ×1

## 2014-03-29 NOTE — ED Provider Notes (Signed)
CSN: [id number]     Arrival date & time 03/29/14  0865 History   First MD Initiated Contact with Patient 03/29/14 0326     Chief Complaint  Patient presents with  . Hematuria  . Abdominal Pain     (Consider location/radiation/quality/duration/timing/severity/associated sxs/prior Treatment) HPI Comments: PT comes in with cc of abd pain and bloody urine. Pt has hx of HTN. States that pain started about 2 days ago, and is intermittent, located around the umbilicus. Pain is sharp and severe when it comes. He started having bloody urine today. He also has some dysuria. Pt has noted grossly bloody urine. No n/v/f/c.  The history is provided by the patient.    Past Medical History  Diagnosis Date  . Hypertension   . Depression     PTSD per mother   No past surgical history on file. History reviewed. No pertinent family history. History  Substance Use Topics  . Smoking status: Light Tobacco Smoker    Types: Cigarettes  . Smokeless tobacco: Not on file  . Alcohol Use: Yes     Comment: few times a week    Review of Systems  Constitutional: Negative for fever, chills and activity change.  Eyes: Negative for visual disturbance.  Respiratory: Negative for cough, chest tightness and shortness of breath.   Cardiovascular: Negative for chest pain.  Gastrointestinal: Positive for abdominal distention. Negative for nausea.  Genitourinary: Positive for dysuria. Negative for flank pain, enuresis and difficulty urinating.  Musculoskeletal: Negative for arthralgias and neck pain.  Neurological: Positive for dizziness. Negative for light-headedness and headaches.  Psychiatric/Behavioral: Negative for confusion.      Allergies  Review of patient's allergies indicates no known allergies.  Home Medications   Prior to Admission medications   Medication Sig Start Date End Date Taking? Authorizing Provider  cetirizine (ZYRTEC) 10 MG tablet Take 10 mg by mouth at bedtime.   Yes Historical  Provider, MD  ferrous sulfate 325 (65 FE) MG EC tablet Take 325 mg by mouth 2 (two) times daily.   Yes Historical Provider, MD  gabapentin (NEURONTIN) 300 MG capsule Take 300 mg by mouth 4 (four) times daily.   Yes Historical Provider, MD  ibuprofen (ADVIL,MOTRIN) 200 MG tablet Take 400 mg by mouth every 6 (six) hours as needed for moderate pain.   Yes Historical Provider, MD  lisinopril (PRINIVIL,ZESTRIL) 20 MG tablet Take 0.5 tablets (10 mg total) by mouth daily. 09/15/13  Yes Megan Dort, PA-C  Magnesium Oxide 420 MG TABS Take 420 mg by mouth 2 (two) times daily.   Yes Historical Provider, MD  methocarbamol (ROBAXIN) 500 MG tablet Take 2 tablets (1,000 mg total) by mouth every 8 (eight) hours as needed for muscle spasms. 09/15/13  Yes Megan Dort, PA-C  mirtazapine (REMERON) 15 MG tablet Take 15 mg by mouth at bedtime.   Yes Historical Provider, MD  omeprazole (PRILOSEC) 20 MG capsule Take 40 mg by mouth 2 (two) times daily before a meal.   Yes Historical Provider, MD  oxyCODONE (OXY IR/ROXICODONE) 5 MG immediate release tablet Take 1-2 tablets (5-10 mg total) by mouth every 4 (four) hours as needed for severe pain. 09/15/13  Yes Megan Dort, PA-C  potassium chloride SA (K-DUR,KLOR-CON) 20 MEQ tablet Take 20 mEq by mouth 2 (two) times daily.   Yes Historical Provider, MD  prazosin (MINIPRESS) 1 MG capsule Take 1 mg by mouth at bedtime.   Yes Historical Provider, MD  risperiDONE (RISPERDAL) 2 MG tablet Take 1 mg by  mouth 2 (two) times daily.   Yes Historical Provider, MD  traMADol (ULTRAM) 50 MG tablet Take 1-2 tablets (50-100 mg total) by mouth every 6 (six) hours as needed for moderate pain or severe pain. 09/15/13  Yes Megan Dort, PA-C  zolpidem (AMBIEN) 10 MG tablet Take 10 mg by mouth at bedtime as needed for sleep.   Yes Historical Provider, MD   BP 120/64  Pulse 66  Temp(Src) 98.6 F (37 C) (Oral)  Resp 18  SpO2 97% Physical Exam  Nursing note and vitals reviewed. Constitutional: He is  oriented to person, place, and time. He appears well-developed.  HENT:  Head: Normocephalic and atraumatic.  Eyes: Conjunctivae and EOM are normal. Pupils are equal, round, and reactive to light.  Neck: Normal range of motion. Neck supple.  Cardiovascular: Normal rate and regular rhythm.   Pulmonary/Chest: Effort normal and breath sounds normal.  Abdominal: Soft. Bowel sounds are normal. He exhibits no distension. There is tenderness. There is no rebound and no guarding.  Lower quadrant tenderness w/ no rebound or guarding  Neurological: He is alert and oriented to person, place, and time.  Skin: Skin is warm.    ED Course  Procedures (including critical care time) Labs Review Labs Reviewed  CBC - Abnormal; Notable for the following:    RBC 4.21 (*)    Hemoglobin 9.8 (*)    HCT 30.3 (*)    MCV 72.0 (*)    MCH 23.3 (*)    RDW 16.2 (*)    All other components within normal limits  COMPREHENSIVE METABOLIC PANEL - Abnormal; Notable for the following:    Potassium 3.0 (*)    Glucose, Bld 144 (*)    Albumin 3.1 (*)    Total Bilirubin <0.2 (*)    All other components within normal limits  URINALYSIS, ROUTINE W REFLEX MICROSCOPIC - Abnormal; Notable for the following:    Color, Urine BROWN (*)    APPearance CLOUDY (*)    Hgb urine dipstick LARGE (*)    Protein, ur 30 (*)    Leukocytes, UA SMALL (*)    All other components within normal limits  URINE MICROSCOPIC-ADD ON - Abnormal; Notable for the following:    Bacteria, UA FEW (*)    All other components within normal limits  URINE CULTURE  LIPASE, BLOOD  URIC ACID    Imaging Review Ct Abdomen Pelvis Wo Contrast  03/29/2014   CLINICAL DATA:  Pain, hematuria.  EXAM: CT ABDOMEN AND PELVIS WITHOUT CONTRAST  TECHNIQUE: Multidetector CT imaging of the abdomen and pelvis was performed following the standard protocol without IV contrast.  COMPARISON:  Prior CT from 09/02/2013 and radiograph from 09/10/2013.  FINDINGS: The visualized  lung bases are clear.  The liver demonstrates a normal unenhanced appearance. Gallbladder is surgically absent. No biliary ductal dilatation. The spleen, adrenal glands, and pancreas demonstrate a normal unenhanced appearance.  There is an obstructive 7 mm stone at the right UPJ with secondary moderate right hydronephrosis. Additional nonobstructive calculi measuring up to 5 mm present within the right kidney. Distally, there is an additional obstructive 8 mm calculus at the right UVJ. Moderate hydroureter is present. There is mild right perinephric and periureteral fat stranding.  On the left, multiple nonobstructive calculi measuring up to 1.2 cm is present. No obstructive stone seen along the course of the left renal collecting system.  Stomach is normal. No evidence of bowel obstruction. No abnormal wall thickening or inflammatory stranding seen about the bowels.  Bladder within normal limits.  Prostate unremarkable.  Small fat containing inguinal hernias noted bilaterally.  No free air or fluid.  No adenopathy.  No acute osseous abnormality. Compression deformities of the T12, L1, and L2 vertebral bodies are grossly stable from prior radiograph. There is associated 8 mm of retropulsion at the L2 level. Remotely healed fractures of the left lateral eighth and ninth ribs noted. No worrisome lytic or blastic osseous lesions.  IMPRESSION: 1. Obstructive 8 mm calculus at the right UVJ with secondary moderate right hydroureteronephrosis. 2. Additional obstructive 7 mm right UPJ stone. 3. Additional bilateral nonobstructive nephrolithiasis as above. 4. Chronic compression deformities of the T12, L1, and L2 vertebral bodies, stable from prior radiograph from 09/10/2013.   Electronically Signed   By: Rise MuBenjamin  McClintock M.D.   On: 03/29/2014 06:49     EKG Interpretation None      MDM   Final diagnoses:  Ureterolithiasis  Acute unilateral obstructive uropathy  Hydronephrosis of right kidney    Pt comes  in with vague lower quadrant abd pain and gross hematuria. Pt's CT shows obstructive uropathy. Pain is in relative control. Seems like dysuria likely from the spasms. Ceftriaxone given in the ER though. Spoke with Urology - and Dr. Patsi Searsannenbaum and associates to see the patient and decide the final dispo.    Derwood KaplanAnkit Gregary Blackard, MD 03/29/14 [phone]

## 2014-03-29 NOTE — H&P (View-Only) (Signed)
Urology Consult   Physician requesting consult: Dr. Rhunette Croft, ER  Reason for consult: nephrolithiasis  History of Present Illness: Brian Cisneros is a 53 y.o. otherwise healthy with no prior urologic history who presents with a few days of right flank pain and gross hematuria. Some nausea/minimal vomiting. No fevers. He has never had symptoms like this before. No exacerbating factors. No alleviating factors. The pain has not subsided. He has had some mild dysuria and urinary frequency. He has not eaten since last night.  He denies a history of voiding or storage urinary symptoms, hematuria, UTIs, STDs, urolithiasis, GU malignancy/trauma/surgery.  Past Medical History  Diagnosis Date  . Hypertension   . Depression     PTSD per mother    No past surgical history on file.  Current Hospital Medications:  Home Meds:    Medication List    ASK your doctor about these medications       cetirizine 10 MG tablet  Commonly known as:  ZYRTEC  Take 10 mg by mouth at bedtime.     ferrous sulfate 325 (65 FE) MG EC tablet  Take 325 mg by mouth 2 (two) times daily.     gabapentin 300 MG capsule  Commonly known as:  NEURONTIN  Take 300 mg by mouth 4 (four) times daily.     ibuprofen 200 MG tablet  Commonly known as:  ADVIL,MOTRIN  Take 400 mg by mouth every 6 (six) hours as needed for moderate pain.     lisinopril 20 MG tablet  Commonly known as:  PRINIVIL,ZESTRIL  Take 0.5 tablets (10 mg total) by mouth daily.     Magnesium Oxide 420 MG Tabs  Take 420 mg by mouth 2 (two) times daily.     methocarbamol 500 MG tablet  Commonly known as:  ROBAXIN  Take 2 tablets (1,000 mg total) by mouth every 8 (eight) hours as needed for muscle spasms.     mirtazapine 15 MG tablet  Commonly known as:  REMERON  Take 15 mg by mouth at bedtime.     omeprazole 20 MG capsule  Commonly known as:  PRILOSEC  Take 40 mg by mouth 2 (two) times daily before a meal.     oxyCODONE 5 MG immediate  release tablet  Commonly known as:  Oxy IR/ROXICODONE  Take 1-2 tablets (5-10 mg total) by mouth every 4 (four) hours as needed for severe pain.     potassium chloride SA 20 MEQ tablet  Commonly known as:  K-DUR,KLOR-CON  Take 20 mEq by mouth 2 (two) times daily.     prazosin 1 MG capsule  Commonly known as:  MINIPRESS  Take 1 mg by mouth at bedtime.     risperiDONE 2 MG tablet  Commonly known as:  RISPERDAL  Take 1 mg by mouth 2 (two) times daily.     traMADol 50 MG tablet  Commonly known as:  ULTRAM  Take 1-2 tablets (50-100 mg total) by mouth every 6 (six) hours as needed for moderate pain or severe pain.     zolpidem 10 MG tablet  Commonly known as:  AMBIEN  Take 10 mg by mouth at bedtime as needed for sleep.        Scheduled Meds: Continuous Infusions: PRN Meds:.    Allergies: No Known Allergies  History reviewed. No pertinent family history.  Social History:  reports that he has been smoking Cigarettes.  He has been smoking about 0.00 packs per day. He does not have any smokeless  tobacco history on file. He reports that he drinks alcohol. He reports that he does not use illicit drugs.  ROS: A complete review of systems was performed.  All systems are negative except for pertinent findings as noted.  Physical Exam:  Vital signs in last 24 hours: Temp:  [98.6 F (37 C)] 98.6 F (37 C) (07/26 0241) Pulse Rate:  [65-85] 66 (07/26 0600) Resp:  [18] 18 (07/26 0241) BP: (120-137)/(64-98) 120/64 mmHg (07/26 0600) SpO2:  [96 %-100 %] 97 % (07/26 0600) Constitutional:  Alert and oriented, No acute distress Cardiovascular: Regular rate and rhythm, No JVD Respiratory: Normal respiratory effort, Lungs clear bilaterally GI: Abdomen is soft, nontender, nondistended, no abdominal masses GU: No CVA tenderness Lymphatic: No lymphadenopathy Neurologic: Grossly intact, no focal deficits Psychiatric: Normal mood and affect  Laboratory Data:   Recent Labs  03/29/14 0307   WBC 6.6  HGB 9.8*  HCT 30.3*  PLT 328     Recent Labs  03/29/14 0307  NA 137  K 3.0*  CL 105  GLUCOSE 144*  BUN 9  CALCIUM 9.0  CREATININE 0.66     Results for orders placed during the hospital encounter of 03/29/14 (from the past 24 hour(s))  CBC     Status: Abnormal   Collection Time    03/29/14  3:07 AM      Result Value Ref Range   WBC 6.6  4.0 - 10.5 K/uL   RBC 4.21 (*) 4.22 - 5.81 MIL/uL   Hemoglobin 9.8 (*) 13.0 - 17.0 g/dL   HCT 16.130.3 (*) 09.639.0 - 04.552.0 %   MCV 72.0 (*) 78.0 - 100.0 fL   MCH 23.3 (*) 26.0 - 34.0 pg   MCHC 32.3  30.0 - 36.0 g/dL   RDW 40.916.2 (*) 81.111.5 - 91.415.5 %   Platelets 328  150 - 400 K/uL  COMPREHENSIVE METABOLIC PANEL     Status: Abnormal   Collection Time    03/29/14  3:07 AM      Result Value Ref Range   Sodium 137  137 - 147 mEq/L   Potassium 3.0 (*) 3.7 - 5.3 mEq/L   Chloride 105  96 - 112 mEq/L   CO2 20  19 - 32 mEq/L   Glucose, Bld 144 (*) 70 - 99 mg/dL   BUN 9  6 - 23 mg/dL   Creatinine, Ser 7.820.66  0.50 - 1.35 mg/dL   Calcium 9.0  8.4 - 95.610.5 mg/dL   Total Protein 6.3  6.0 - 8.3 g/dL   Albumin 3.1 (*) 3.5 - 5.2 g/dL   AST 15  0 - 37 U/L   ALT 14  0 - 53 U/L   Alkaline Phosphatase 97  39 - 117 U/L   Total Bilirubin <0.2 (*) 0.3 - 1.2 mg/dL   GFR calc non Af Amer >90  >90 mL/min   GFR calc Af Amer >90  >90 mL/min   Anion gap 12  5 - 15  URINALYSIS, ROUTINE W REFLEX MICROSCOPIC     Status: Abnormal   Collection Time    03/29/14  3:17 AM      Result Value Ref Range   Color, Urine BROWN (*) YELLOW   APPearance CLOUDY (*) CLEAR   Specific Gravity, Urine 1.023  1.005 - 1.030   pH 6.5  5.0 - 8.0   Glucose, UA NEGATIVE  NEGATIVE mg/dL   Hgb urine dipstick LARGE (*) NEGATIVE   Bilirubin Urine NEGATIVE  NEGATIVE   Ketones, ur  NEGATIVE  NEGATIVE mg/dL   Protein, ur 30 (*) NEGATIVE mg/dL   Urobilinogen, UA 0.2  0.0 - 1.0 mg/dL   Nitrite NEGATIVE  NEGATIVE   Leukocytes, UA SMALL (*) NEGATIVE  URINE MICROSCOPIC-ADD ON     Status:  Abnormal   Collection Time    03/29/14  3:17 AM      Result Value Ref Range   WBC, UA 3-6  <3 WBC/hpf   RBC / HPF TOO NUMEROUS TO COUNT  <3 RBC/hpf   Bacteria, UA FEW (*) RARE   Urine-Other MUCOUS PRESENT    LIPASE, BLOOD     Status: None   Collection Time    03/29/14  5:07 AM      Result Value Ref Range   Lipase 50  11 - 59 U/L   No results found for this or any previous visit (from the past 240 hour(s)).  Renal Function:  Recent Labs  03/29/14 0307  CREATININE 0.66   The CrCl is unknown because both a height and weight (above a minimum accepted value) are required for this calculation.  Radiologic Imaging: Ct Abdomen Pelvis Wo Contrast  03/29/2014   CLINICAL DATA:  Pain, hematuria.  EXAM: CT ABDOMEN AND PELVIS WITHOUT CONTRAST  TECHNIQUE: Multidetector CT imaging of the abdomen and pelvis was performed following the standard protocol without IV contrast.  COMPARISON:  Prior CT from 09/02/2013 and radiograph from 09/10/2013.  FINDINGS: The visualized lung bases are clear.  The liver demonstrates a normal unenhanced appearance. Gallbladder is surgically absent. No biliary ductal dilatation. The spleen, adrenal glands, and pancreas demonstrate a normal unenhanced appearance.  There is an obstructive 7 mm stone at the right UPJ with secondary moderate right hydronephrosis. Additional nonobstructive calculi measuring up to 5 mm present within the right kidney. Distally, there is an additional obstructive 8 mm calculus at the right UVJ. Moderate hydroureter is present. There is mild right perinephric and periureteral fat stranding.  On the left, multiple nonobstructive calculi measuring up to 1.2 cm is present. No obstructive stone seen along the course of the left renal collecting system.  Stomach is normal. No evidence of bowel obstruction. No abnormal wall thickening or inflammatory stranding seen about the bowels.  Bladder within normal limits.  Prostate unremarkable.  Small fat containing  inguinal hernias noted bilaterally.  No free air or fluid.  No adenopathy.  No acute osseous abnormality. Compression deformities of the T12, L1, and L2 vertebral bodies are grossly stable from prior radiograph. There is associated 8 mm of retropulsion at the L2 level. Remotely healed fractures of the left lateral eighth and ninth ribs noted. No worrisome lytic or blastic osseous lesions.  IMPRESSION: 1. Obstructive 8 mm calculus at the right UVJ with secondary moderate right hydroureteronephrosis. 2. Additional obstructive 7 mm right UPJ stone. 3. Additional bilateral nonobstructive nephrolithiasis as above. 4. Chronic compression deformities of the T12, L1, and L2 vertebral bodies, stable from prior radiograph from 09/10/2013.   Electronically Signed   By: Rise Mu M.D.   On: 03/29/2014 06:49    I independently reviewed the above imaging studies.  Impression/Recommendation 52yM with right ureterolithiasis and hydro but no AKI or infectious signs. He has non-obstructing stones in his left kidney. Given his high stone burden and contralateral stones, he is at high risk for complete obstruction. We have recommended cystoscopy, right ureteroscopy and removal of UVJ stone, right stent for UPJ stone with later treatment. Risks/benefits/alternative therapies discussed and patient has consented.   Emelda Fear,  Guy Sandifer 03/29/2014, 7:50 AM    Pt seen with Dr. Patsi Sears

## 2014-03-29 NOTE — ED Notes (Signed)
Pt to go to OR and then to assigned room

## 2014-03-29 NOTE — Anesthesia Preprocedure Evaluation (Signed)
Anesthesia Evaluation  Patient identified by MRN, date of birth, ID band Patient awake    Reviewed: Allergy & Precautions, H&P , Patient's Chart, lab work & pertinent test results, reviewed documented beta blocker date and time   History of Anesthesia Complications Negative for: history of anesthetic complications  Airway Mallampati: II TM Distance: >3 FB Neck ROM: full    Dental   Pulmonary Current Smoker,  breath sounds clear to auscultation        Cardiovascular Exercise Tolerance: Good hypertension, Rhythm:regular Rate:Normal     Neuro/Psych negative psych ROS   GI/Hepatic   Endo/Other    Renal/GU      Musculoskeletal   Abdominal   Peds  Hematology   Anesthesia Other Findings   Reproductive/Obstetrics                           Anesthesia Physical Anesthesia Plan  ASA: II and emergent  Anesthesia Plan: General LMA   Post-op Pain Management:    Induction:   Airway Management Planned:   Additional Equipment:   Intra-op Plan:   Post-operative Plan:   Informed Consent: I have reviewed the patients History and Physical, chart, labs and discussed the procedure including the risks, benefits and alternatives for the proposed anesthesia with the patient or authorized representative who has indicated his/her understanding and acceptance.   Dental Advisory Given  Plan Discussed with: CRNA, Surgeon and Anesthesiologist  Anesthesia Plan Comments:         Anesthesia Quick Evaluation

## 2014-03-29 NOTE — ED Provider Notes (Addendum)
EKG Interpretation  Date/Time:  Sunday March 29 2014 08:20:24 EDT Ventricular Rate:  62 PR Interval:  139 QRS Duration: 117 QT Interval:  434 QTC Calculation: 441 R Axis:   71 Text Interpretation:  Sinus rhythm Incomplete right bundle branch block Since last tracing QT has shortened Confirmed by Effie ShyWENTZ  MD, Parry Po [phone]) on 03/29/2014 8:41:33 AM         Flint MelterElliott L Linet Brash, MD 03/29/14 [phone]

## 2014-03-29 NOTE — ED Notes (Signed)
Pt got up to use bathroom tonight and noticed a lot of bright red blood,  He is having lower abdominal pain

## 2014-03-29 NOTE — ED Notes (Signed)
Pt asleep , no complaints of pain

## 2014-03-29 NOTE — ED Notes (Signed)
Correction to prior entry,  Pt states has has had pink tinged urine for a few days and then bright red tonight,  States surgery at [facility]  Two weeks ago for bleeding ulcers

## 2014-03-29 NOTE — Op Note (Signed)
Agree with  Operative note.

## 2014-03-29 NOTE — ED Notes (Signed)
Lab draw: Per Natasha in lab, LIPASE will be added on to blood draw at 0307hrs

## 2014-03-29 NOTE — Anesthesia Postprocedure Evaluation (Signed)
  Anesthesia Post-op Note  Patient: Brian Cisneros  Procedure(s) Performed: Procedure(s): CYSTOSCOPY/RETROGRADE/URETEROSCOPY/STONE EXTRACTION WITH BASKET, STENT PLACEMENT (Right)  Patient Location: PACU  Anesthesia Type:General  Level of Consciousness: awake, alert  and oriented  Airway and Oxygen Therapy: Patient Spontanous Breathing  Post-op Pain: mild  Post-op Assessment: Post-op Vital signs reviewed, Patient's Cardiovascular Status Stable, Respiratory Function Stable, Patent Airway, No signs of Nausea or vomiting, Adequate PO intake and Pain level controlled  Post-op Vital Signs: Reviewed and stable  Last Vitals:  Filed Vitals:   03/29/14 1140  BP:   Pulse: 72  Temp:   Resp: 19    Complications: No apparent anesthesia complications

## 2014-03-29 NOTE — Consult Note (Signed)
Urology Consult   Physician requesting consult: Dr. Nanavati, ER  Reason for consult: nephrolithiasis  History of Present Illness: Brian Cisneros is a 53 y.o. otherwise healthy with no prior urologic history who presents with a few days of right flank pain and gross hematuria. Some nausea/minimal vomiting. No fevers. He has never had symptoms like this before. No exacerbating factors. No alleviating factors. The pain has not subsided. He has had some mild dysuria and urinary frequency. He has not eaten since last night.  He denies a history of voiding or storage urinary symptoms, hematuria, UTIs, STDs, urolithiasis, GU malignancy/trauma/surgery.  Past Medical History  Diagnosis Date  . Hypertension   . Depression     PTSD per mother    No past surgical history on file.  Current Hospital Medications:  Home Meds:    Medication List    ASK your doctor about these medications       cetirizine 10 MG tablet  Commonly known as:  ZYRTEC  Take 10 mg by mouth at bedtime.     ferrous sulfate 325 (65 FE) MG EC tablet  Take 325 mg by mouth 2 (two) times daily.     gabapentin 300 MG capsule  Commonly known as:  NEURONTIN  Take 300 mg by mouth 4 (four) times daily.     ibuprofen 200 MG tablet  Commonly known as:  ADVIL,MOTRIN  Take 400 mg by mouth every 6 (six) hours as needed for moderate pain.     lisinopril 20 MG tablet  Commonly known as:  PRINIVIL,ZESTRIL  Take 0.5 tablets (10 mg total) by mouth daily.     Magnesium Oxide 420 MG Tabs  Take 420 mg by mouth 2 (two) times daily.     methocarbamol 500 MG tablet  Commonly known as:  ROBAXIN  Take 2 tablets (1,000 mg total) by mouth every 8 (eight) hours as needed for muscle spasms.     mirtazapine 15 MG tablet  Commonly known as:  REMERON  Take 15 mg by mouth at bedtime.     omeprazole 20 MG capsule  Commonly known as:  PRILOSEC  Take 40 mg by mouth 2 (two) times daily before a meal.     oxyCODONE 5 MG immediate  release tablet  Commonly known as:  Oxy IR/ROXICODONE  Take 1-2 tablets (5-10 mg total) by mouth every 4 (four) hours as needed for severe pain.     potassium chloride SA 20 MEQ tablet  Commonly known as:  K-DUR,KLOR-CON  Take 20 mEq by mouth 2 (two) times daily.     prazosin 1 MG capsule  Commonly known as:  MINIPRESS  Take 1 mg by mouth at bedtime.     risperiDONE 2 MG tablet  Commonly known as:  RISPERDAL  Take 1 mg by mouth 2 (two) times daily.     traMADol 50 MG tablet  Commonly known as:  ULTRAM  Take 1-2 tablets (50-100 mg total) by mouth every 6 (six) hours as needed for moderate pain or severe pain.     zolpidem 10 MG tablet  Commonly known as:  AMBIEN  Take 10 mg by mouth at bedtime as needed for sleep.        Scheduled Meds: Continuous Infusions: PRN Meds:.    Allergies: No Known Allergies  History reviewed. No pertinent family history.  Social History:  reports that he has been smoking Cigarettes.  He has been smoking about 0.00 packs per day. He does not have any smokeless   tobacco history on file. He reports that he drinks alcohol. He reports that he does not use illicit drugs.  ROS: A complete review of systems was performed.  All systems are negative except for pertinent findings as noted.  Physical Exam:  Vital signs in last 24 hours: Temp:  [98.6 F (37 C)] 98.6 F (37 C) (07/26 0241) Pulse Rate:  [65-85] 66 (07/26 0600) Resp:  [18] 18 (07/26 0241) BP: (120-137)/(64-98) 120/64 mmHg (07/26 0600) SpO2:  [96 %-100 %] 97 % (07/26 0600) Constitutional:  Alert and oriented, No acute distress Cardiovascular: Regular rate and rhythm, No JVD Respiratory: Normal respiratory effort, Lungs clear bilaterally GI: Abdomen is soft, nontender, nondistended, no abdominal masses GU: No CVA tenderness Lymphatic: No lymphadenopathy Neurologic: Grossly intact, no focal deficits Psychiatric: Normal mood and affect  Laboratory Data:   Recent Labs  03/29/14 0307   WBC 6.6  HGB 9.8*  HCT 30.3*  PLT 328     Recent Labs  03/29/14 0307  NA 137  K 3.0*  CL 105  GLUCOSE 144*  BUN 9  CALCIUM 9.0  CREATININE 0.66     Results for orders placed during the hospital encounter of 03/29/14 (from the past 24 hour(s))  CBC     Status: Abnormal   Collection Time    03/29/14  3:07 AM      Result Value Ref Range   WBC 6.6  4.0 - 10.5 K/uL   RBC 4.21 (*) 4.22 - 5.81 MIL/uL   Hemoglobin 9.8 (*) 13.0 - 17.0 g/dL   HCT 30.3 (*) 39.0 - 52.0 %   MCV 72.0 (*) 78.0 - 100.0 fL   MCH 23.3 (*) 26.0 - 34.0 pg   MCHC 32.3  30.0 - 36.0 g/dL   RDW 16.2 (*) 11.5 - 15.5 %   Platelets 328  150 - 400 K/uL  COMPREHENSIVE METABOLIC PANEL     Status: Abnormal   Collection Time    03/29/14  3:07 AM      Result Value Ref Range   Sodium 137  137 - 147 mEq/L   Potassium 3.0 (*) 3.7 - 5.3 mEq/L   Chloride 105  96 - 112 mEq/L   CO2 20  19 - 32 mEq/L   Glucose, Bld 144 (*) 70 - 99 mg/dL   BUN 9  6 - 23 mg/dL   Creatinine, Ser 0.66  0.50 - 1.35 mg/dL   Calcium 9.0  8.4 - 10.5 mg/dL   Total Protein 6.3  6.0 - 8.3 g/dL   Albumin 3.1 (*) 3.5 - 5.2 g/dL   AST 15  0 - 37 U/L   ALT 14  0 - 53 U/L   Alkaline Phosphatase 97  39 - 117 U/L   Total Bilirubin <0.2 (*) 0.3 - 1.2 mg/dL   GFR calc non Af Amer >90  >90 mL/min   GFR calc Af Amer >90  >90 mL/min   Anion gap 12  5 - 15  URINALYSIS, ROUTINE W REFLEX MICROSCOPIC     Status: Abnormal   Collection Time    03/29/14  3:17 AM      Result Value Ref Range   Color, Urine BROWN (*) YELLOW   APPearance CLOUDY (*) CLEAR   Specific Gravity, Urine 1.023  1.005 - 1.030   pH 6.5  5.0 - 8.0   Glucose, UA NEGATIVE  NEGATIVE mg/dL   Hgb urine dipstick LARGE (*) NEGATIVE   Bilirubin Urine NEGATIVE  NEGATIVE   Ketones, ur   NEGATIVE  NEGATIVE mg/dL   Protein, ur 30 (*) NEGATIVE mg/dL   Urobilinogen, UA 0.2  0.0 - 1.0 mg/dL   Nitrite NEGATIVE  NEGATIVE   Leukocytes, UA SMALL (*) NEGATIVE  URINE MICROSCOPIC-ADD ON     Status:  Abnormal   Collection Time    03/29/14  3:17 AM      Result Value Ref Range   WBC, UA 3-6  <3 WBC/hpf   RBC / HPF TOO NUMEROUS TO COUNT  <3 RBC/hpf   Bacteria, UA FEW (*) RARE   Urine-Other MUCOUS PRESENT    LIPASE, BLOOD     Status: None   Collection Time    03/29/14  5:07 AM      Result Value Ref Range   Lipase 50  11 - 59 U/L   No results found for this or any previous visit (from the past 240 hour(s)).  Renal Function:  Recent Labs  03/29/14 0307  CREATININE 0.66   The CrCl is unknown because both a height and weight (above a minimum accepted value) are required for this calculation.  Radiologic Imaging: Ct Abdomen Pelvis Wo Contrast  03/29/2014   CLINICAL DATA:  Pain, hematuria.  EXAM: CT ABDOMEN AND PELVIS WITHOUT CONTRAST  TECHNIQUE: Multidetector CT imaging of the abdomen and pelvis was performed following the standard protocol without IV contrast.  COMPARISON:  Prior CT from 09/02/2013 and radiograph from 09/10/2013.  FINDINGS: The visualized lung bases are clear.  The liver demonstrates a normal unenhanced appearance. Gallbladder is surgically absent. No biliary ductal dilatation. The spleen, adrenal glands, and pancreas demonstrate a normal unenhanced appearance.  There is an obstructive 7 mm stone at the right UPJ with secondary moderate right hydronephrosis. Additional nonobstructive calculi measuring up to 5 mm present within the right kidney. Distally, there is an additional obstructive 8 mm calculus at the right UVJ. Moderate hydroureter is present. There is mild right perinephric and periureteral fat stranding.  On the left, multiple nonobstructive calculi measuring up to 1.2 cm is present. No obstructive stone seen along the course of the left renal collecting system.  Stomach is normal. No evidence of bowel obstruction. No abnormal wall thickening or inflammatory stranding seen about the bowels.  Bladder within normal limits.  Prostate unremarkable.  Small fat containing  inguinal hernias noted bilaterally.  No free air or fluid.  No adenopathy.  No acute osseous abnormality. Compression deformities of the T12, L1, and L2 vertebral bodies are grossly stable from prior radiograph. There is associated 8 mm of retropulsion at the L2 level. Remotely healed fractures of the left lateral eighth and ninth ribs noted. No worrisome lytic or blastic osseous lesions.  IMPRESSION: 1. Obstructive 8 mm calculus at the right UVJ with secondary moderate right hydroureteronephrosis. 2. Additional obstructive 7 mm right UPJ stone. 3. Additional bilateral nonobstructive nephrolithiasis as above. 4. Chronic compression deformities of the T12, L1, and L2 vertebral bodies, stable from prior radiograph from 09/10/2013.   Electronically Signed   By: Benjamin  McClintock M.D.   On: 03/29/2014 06:49    I independently reviewed the above imaging studies.  Impression/Recommendation 52yM with right ureterolithiasis and hydro but no AKI or infectious signs. He has non-obstructing stones in his left kidney. Given his high stone burden and contralateral stones, he is at high risk for complete obstruction. We have recommended cystoscopy, right ureteroscopy and removal of UVJ stone, right stent for UPJ stone with later treatment. Risks/benefits/alternative therapies discussed and patient has consented.   Markisha Meding,   Rhaelyn Giron E 03/29/2014, 7:50 AM    Pt seen with Dr. Tannenbaum 

## 2014-03-29 NOTE — Consult Note (Signed)
53 yo male with  2 large Right ureteral stones. He ha a distal Right U-V junction stone and a prosimal UPJ stone, both 8-629mm. He needs extraction of the lower stone, and jj stenting of the upper stone. He has bilateral nephrolithiasis, and will need eventual 24 hr urine testing.     I have seen and agree with Dr. Rayna SextonFerguson's A/P.

## 2014-03-29 NOTE — ED Notes (Signed)
Patient transported to X-ray 

## 2014-03-29 NOTE — Interval H&P Note (Signed)
History and Physical Interval Note:  03/29/2014 10:05 AM  Brian Cisneros  has presented today for surgery, with the diagnosis of ureteral stone  The various methods of treatment have been discussed with the patient and family. After consideration of risks, benefits and other options for treatment, the patient has consented to  Procedure(s): CYSTOSCOPY/RETROGRADE/URETEROSCOPY/STONE EXTRACTION WITH BASKET, STENT PLACEMENT (Right) HOLMIUM LASER APPLICATION (Right) as a surgical intervention .  The patient's history has been reviewed, patient examined, no change in status, stable for surgery.  I have reviewed the patient's chart and labs.  Questions were answered to the patient's satisfaction.     Jethro BolusANNENBAUM, Gresham Caetano I

## 2014-03-29 NOTE — Op Note (Signed)
Preoperative Diagnosis: nephrolithiasis, right flank pain  Postoperative Diagnosis:  same  Procedure(s) Performed:  Cystourethroscopy, Right ureteral stent placement, Right ureteroscopy, Basket stone extraction, Right retrograde pyelogram and Intraoperative fluoroscopy with interpretation <1 hr  Teaching Surgeon:  Jethro BolusSigmund Tannenbaum, MD  Resident Surgeon:  Dyke BrackettJames "Jed" Lujuana Kapler, MD  Assistant(s):  none  Anesthesia:  General via LMA  Fluids:  See anesthesia record  Estimated blood loss:  5 mL  Specimens:  Stone for analysis  Cultures:  none  Drains:  6x24cm R ureteral stent without string dangler  Complications:  none  Indications: See consult note. 52yM with R flank pain and nausea. CT scan showing 2 right ureteral stones, one at UVJ, one at UPJ. Also 2 nonobstructing left renal stones. After discussion of risks/benefits/alternative therapies, patient agreed to ureteroscopic extraction of R UVJ stone, with stent placement, followed by ESWL of UPJ stone.  Findings:  1. Cystourethroscopy was normal. Mild prostatic hypertrophy without median lobe. 2. The UVJ stone was crowning. This appeared CaOx. This was removed intact with basket. 3. RPG showed the UPJ stone now in the pelvis. This was able to be visualized fluoroscopically (barely). 4. Stent in good position at end of case.  Description:  The patient was correctly identified in the preop holding area where written informed consent as well potential risk and complication reviewed. He agreed. They were brought back to the operative suite where a preinduction timeout was performed. Once correct information was verified, general anesthesia was induced via endotracheal tube. They were then gently placed into dorsal lithotomy position with SCDs in place for VTE prophylaxis. They were prepped and draped in the usual sterile fashion and given appropriate preoperative antibiotics with cipro.  We inserted a 46F rigid cystoscope per urethra  with copious lubrication and normal saline irrigation running. This demonstrated findings as described above.    A sensor wire was advanced past the stone up to the kidney.  The 6.4Fr short semirigid ureteroscope was advanced and the stone was grasped with zero tip nitinol and removed intact.  An open ended catheter was advanced over the sensor wire. A RPG was performed, finding as above.  The cystoscope was backloaded over the sensor wire and the stent deployed with good curl in renal pelvis and bladder.  The patient's bladder was emptied, they were awoken from anesthesia, and taken to the recovery room in good condition.   Post Op Plan:   1. Discharge patient home when meets PACU criteria. 2. Follow up in next week or two with Dr. Patsi Searsannenbaum with [facility] prior for ESWL planning.  Attestation:  Dr. Patsi Searsannenbaum was present for key portions of the procedure.    Yenny Kosa "Jed" Emelda FearFerguson, MD Resident, Department of Urology

## 2014-03-29 NOTE — Transfer of Care (Signed)
Immediate Anesthesia Transfer of Care Note  Patient: Brian Cisneros  Procedure(s) Performed: Procedure(s): CYSTOSCOPY/RETROGRADE/URETEROSCOPY/STONE EXTRACTION WITH BASKET, STENT PLACEMENT (Right)  Patient Location: PACU  Anesthesia Type:General  Level of Consciousness: awake and oriented  Airway & Oxygen Therapy: Patient Spontanous Breathing and Patient connected to face mask oxygen  Post-op Assessment: Report given to PACU RN and Post -op Vital signs reviewed and stable  Post vital signs: Reviewed and stable  Complications: No apparent anesthesia complications

## 2014-03-29 NOTE — H&P (View-Only) (Signed)
52 yo male with  2 large Right ureteral stones. He ha a distal Right U-V junction stone and a prosimal UPJ stone, both 8-9mm. He needs extraction of the lower stone, and jj stenting of the upper stone. He has bilateral nephrolithiasis, and will need eventual 24 hr urine testing.     I have seen and agree with Dr. Ferguson's A/P.  

## 2014-03-29 NOTE — ED Notes (Signed)
Bed: [id number] Expected date:  Expected time:  Means of arrival:  Comments: EMS abd pain . Bloody urine

## 2014-03-29 NOTE — ED Notes (Signed)
Pt returned from xray

## 2014-03-30 ENCOUNTER — Encounter (HOSPITAL_COMMUNITY): Payer: Self-pay | Admitting: Urology

## 2014-03-30 LAB — URINE CULTURE
COLONY COUNT: NO GROWTH
CULTURE: NO GROWTH

## 2014-04-01 ENCOUNTER — Emergency Department (HOSPITAL_COMMUNITY): Payer: Non-veteran care

## 2014-04-01 ENCOUNTER — Emergency Department (HOSPITAL_COMMUNITY)
Admission: EM | Admit: 2014-04-01 | Discharge: 2014-04-01 | Disposition: A | Discharge status: Home / Self Care | Payer: Non-veteran care | Attending: Emergency Medicine | Admitting: Emergency Medicine

## 2014-04-01 ENCOUNTER — Encounter (HOSPITAL_COMMUNITY): Payer: Self-pay | Admitting: Emergency Medicine

## 2014-04-01 DIAGNOSIS — I10 Essential (primary) hypertension: Secondary | ICD-10-CM | POA: Insufficient documentation

## 2014-04-01 DIAGNOSIS — F329 Major depressive disorder, single episode, unspecified: Secondary | ICD-10-CM | POA: Insufficient documentation

## 2014-04-01 DIAGNOSIS — R109 Unspecified abdominal pain: Secondary | ICD-10-CM | POA: Insufficient documentation

## 2014-04-01 DIAGNOSIS — IMO0002 Reserved for concepts with insufficient information to code with codable children: Secondary | ICD-10-CM

## 2014-04-01 DIAGNOSIS — Z79899 Other long term (current) drug therapy: Secondary | ICD-10-CM | POA: Insufficient documentation

## 2014-04-01 DIAGNOSIS — Z792 Long term (current) use of antibiotics: Secondary | ICD-10-CM | POA: Insufficient documentation

## 2014-04-01 DIAGNOSIS — F3289 Other specified depressive episodes: Secondary | ICD-10-CM | POA: Insufficient documentation

## 2014-04-01 DIAGNOSIS — F17200 Nicotine dependence, unspecified, uncomplicated: Secondary | ICD-10-CM | POA: Insufficient documentation

## 2014-04-01 LAB — URINALYSIS, ROUTINE W REFLEX MICROSCOPIC
Bilirubin Urine: NEGATIVE
GLUCOSE, UA: NEGATIVE mg/dL
Ketones, ur: NEGATIVE mg/dL
NITRITE: NEGATIVE
PROTEIN: 100 mg/dL — AB
Specific Gravity, Urine: 1.027 (ref 1.005–1.030)
Urobilinogen, UA: 1 mg/dL (ref 0.0–1.0)
pH: 6.5 (ref 5.0–8.0)

## 2014-04-01 LAB — CBC WITH DIFFERENTIAL/PLATELET
BASOS PCT: 0 % (ref 0–1)
Basophils Absolute: 0 10*3/uL (ref 0.0–0.1)
Eosinophils Absolute: 0.1 10*3/uL (ref 0.0–0.7)
Eosinophils Relative: 1 % (ref 0–5)
HCT: 32.4 % — ABNORMAL LOW (ref 39.0–52.0)
HEMOGLOBIN: 10 g/dL — AB (ref 13.0–17.0)
LYMPHS PCT: 32 % (ref 12–46)
Lymphs Abs: 2.5 10*3/uL (ref 0.7–4.0)
MCH: 22.5 pg — ABNORMAL LOW (ref 26.0–34.0)
MCHC: 30.9 g/dL (ref 30.0–36.0)
MCV: 72.8 fL — ABNORMAL LOW (ref 78.0–100.0)
Monocytes Absolute: 0.7 10*3/uL (ref 0.1–1.0)
Monocytes Relative: 9 % (ref 3–12)
Neutro Abs: 4.6 10*3/uL (ref 1.7–7.7)
Neutrophils Relative %: 58 % (ref 43–77)
PLATELETS: 367 10*3/uL (ref 150–400)
RBC: 4.45 MIL/uL (ref 4.22–5.81)
RDW: 16.6 % — ABNORMAL HIGH (ref 11.5–15.5)
WBC: 7.9 10*3/uL (ref 4.0–10.5)

## 2014-04-01 LAB — BASIC METABOLIC PANEL
Anion gap: 16 — ABNORMAL HIGH (ref 5–15)
BUN: 13 mg/dL (ref 6–23)
CO2: 19 mEq/L (ref 19–32)
Calcium: 8.9 mg/dL (ref 8.4–10.5)
Chloride: 101 mEq/L (ref 96–112)
Creatinine, Ser: 0.59 mg/dL (ref 0.50–1.35)
GFR calc non Af Amer: >90 mL/min (ref >90)
GLUCOSE: 132 mg/dL — AB (ref 70–99)
POTASSIUM: 3.3 meq/L — AB (ref 3.7–5.3)
Sodium: 136 mEq/L — ABNORMAL LOW (ref 137–147)

## 2014-04-01 LAB — URINE MICROSCOPIC-ADD ON

## 2014-04-01 MED ORDER — KETOROLAC TROMETHAMINE 30 MG/ML IJ SOLN
30.0000 mg | Freq: Once | INTRAMUSCULAR | Status: AC
  Administered 2014-04-01: 30 mg via INTRAVENOUS
  Filled 2014-04-01: qty 1

## 2014-04-01 MED ORDER — ONDANSETRON HCL 4 MG/2ML IJ SOLN
4.0000 mg | Freq: Once | INTRAMUSCULAR | Status: AC
  Administered 2014-04-01: 4 mg via INTRAVENOUS
  Filled 2014-04-01: qty 2

## 2014-04-01 MED ORDER — OXYCODONE-ACETAMINOPHEN 5-325 MG PO TABS: 1.0000 | ORAL_TABLET | ORAL | Status: DC | PRN

## 2014-04-01 MED ORDER — HYDROMORPHONE HCL PF 1 MG/ML IJ SOLN
1.0000 mg | Freq: Once | INTRAMUSCULAR | Status: AC
  Administered 2014-04-01: 1 mg via INTRAVENOUS
  Filled 2014-04-01: qty 1

## 2014-04-01 MED ORDER — MORPHINE SULFATE 4 MG/ML IJ SOLN
4.0000 mg | Freq: Once | INTRAMUSCULAR | Status: AC
  Administered 2014-04-01: 4 mg via INTRAVENOUS
  Filled 2014-04-01: qty 1

## 2014-04-01 MED ORDER — SODIUM CHLORIDE 0.9 % IV BOLUS (SEPSIS)
1000.0000 mL | Freq: Once | INTRAVENOUS | Status: AC
  Administered 2014-04-01: 1000 mL via INTRAVENOUS

## 2014-04-01 NOTE — Discharge Instructions (Signed)
Take percocet as needed for pain. Refer to attached documents for more information. Follow up with your Urologist for further evaluation. Return to the ED with worsening or concerning.

## 2014-04-01 NOTE — Consult Note (Signed)
Urology Consult Consulting M.D.: Dr. Clarene DukeMcManus   CC: flank pain  HPI: 53 year old male presented to the emergency room today with rightflank pain of about 6 hours duration.  He underwent rightureteroscopy and stone management of 2 right ureteral stones-one proximal and one distal-on the 26th of this month.  Dr. Cassell Smilesanenbaum was his physician.  The patient had a stent placed.  The patient has had intermittent right flank pain, gross hematuria without clots but no fevers or chills.  He has had no nausea or vomiting.  He took a couple Percocets early this morning, and because of persistent pain, an 8 out of 10, he went to the emergency room where he was treated with IV pain medicine.  That helped somewhat.  He had a renal ultrasound performed after I was consult for phone revealing no hydronephrosis.  No ureteral jet was seen on the right, but the patient didn't have a stent in place.  There was no left hydronephrosis.  At the time of this interview with me, he was comfortable.  PMH: Past Medical History  Diagnosis Date  . Hypertension   . Depression     PTSD per mother    PSH: Past Surgical History  Procedure Laterality Date  . Cystoscopy/retrograde/ureteroscopy/stone extraction with basket Right 03/29/2014    Procedure: CYSTOSCOPY/RETROGRADE/URETEROSCOPY/STONE EXTRACTION WITH BASKET, STENT PLACEMENT;  Surgeon: Kathi LudwigSigmund I Tannenbaum, MD;  Location: [facility];  Service: Urology;  Laterality: Right;    Allergies: No Known Allergies  Medications:  (Not in a hospital admission)   Social History: History   Social History  . Marital Status: Single    Spouse Name: N/A    Number of Children: N/A  . Years of Education: N/A   Occupational History  . Not on file.   Social History Main Topics  . Smoking status: Light Tobacco Smoker    Types: Cigarettes  . Smokeless tobacco: Not on file  . Alcohol Use: Yes     Comment: few times a week  . Drug Use: No  . Sexual Activity: Not on file    Other Topics Concern  . Not on file   Social History Narrative  . No narrative on file    Family History: No family history on file.  Review of Systems: Positive: right flank pain, right lower quadrant pain, gross hematuria Negative:   A further 10 point review of systems was negative except what is listed in the HPI.  Physical Exam: @VITALS2 @ General: No acute distress.  Awake. Head:  Normocephalic.  Atraumatic. ENT:  EOMI.  Mucous membranes moist Neck:  Supple.  No lymphadenopathy. CV:  S1 present. S2 present. Regular rate. Pulmonary: Equal effort bilaterally.  Clear to auscultation bilaterally. Abdomen: Soft.  Minimal right lower quadrant tenderness.  No rebound or guarding.  No CVA tenderness Skin:  Normal turgor.  No visible rash. Extremity: No gross deformity of bilateral upper extremities.  No gross deformity of    bilateral lower extremities. Neurologic: Alert. Appropriate mood.    Studies:  Recent Labs     04/01/14  0717  HGB  10.0*  WBC  7.9  PLT  367    Recent Labs     04/01/14  0717  NA  136*  K  3.3*  CL  101  CO2  19  BUN  13  CREATININE  0.59  CALCIUM  8.9  GFRNONAA  >90  GFRAA  >90     No results found for this basename: PT, INR, APTT,  in the last 72 hours   No components found with this basename: ABG,  I reviewed his renal ultrasound images.  There is no hydronephrosis.  Stent is imaged in the bladder, but not in the decompressed right renal pelvis   Assessment:  Right flank pain status post right ureteroscopy, holmium laser and extraction of right renal calculi and double-J stent placement.  He does not have flank pain, which most likely rules out a blocked stent.  Currently, he is fairly pain free.  Plan: I have reassured the patient and his mother, who attended the visit today.  As he looks comfortable, I will give him a dose of Toradol, and we will see him early next week for cystoscopy and stent placement.  If he is having  problems before then, I recommended that he call our office rather than come to the emergency room.    Pager:[phone]

## 2014-04-01 NOTE — ED Notes (Addendum)
Pt has emergency sx on Sunday for a kidney stone obstruction. Pt states he has another stone that is causing him rt flank extreme pain. Pt is able to urinate and denies any hematuria.

## 2014-04-01 NOTE — ED Provider Notes (Signed)
CSN: [id number]     Arrival date & time 04/01/14  0622 History   First MD Initiated Contact with Patient 04/01/14 [phone]     Chief Complaint  Patient presents with  . Nephrolithiasis     (Consider location/radiation/quality/duration/timing/severity/associated sxs/prior Treatment) HPI Comments: Patient is a 53 year old male who had a right uretal stent placed 4 days ago for an obstructing stone who presents with right flank pain that started last night. The pain is located in the right flank and radiates around to his right abdomen. The pain is described as aching and severe. The pain started gradually and progressively worsened since the onset. No alleviating/aggravating factors. The patient has tried PO Vicodin for symptoms without relief. Associated symptoms include nothing. Patient denies fever, headache, NVD, chest pain, SOB, dysuria, constipation. Patient reports doing well at home after having the stent placed until last night when the pain started.    Past Medical History  Diagnosis Date  . Hypertension   . Depression     PTSD per mother   Past Surgical History  Procedure Laterality Date  . Cystoscopy/retrograde/ureteroscopy/stone extraction with basket Right 03/29/2014    Procedure: CYSTOSCOPY/RETROGRADE/URETEROSCOPY/STONE EXTRACTION WITH BASKET, STENT PLACEMENT;  Surgeon: Kathi Ludwig, MD;  Location: [facility];  Service: Urology;  Laterality: Right;   No family history on file. History  Substance Use Topics  . Smoking status: Light Tobacco Smoker    Types: Cigarettes  . Smokeless tobacco: Not on file  . Alcohol Use: Yes     Comment: few times a week    Review of Systems  Constitutional: Negative for fever, chills and fatigue.  HENT: Negative for trouble swallowing.   Eyes: Negative for visual disturbance.  Respiratory: Negative for shortness of breath.   Cardiovascular: Negative for chest pain and palpitations.  Gastrointestinal: Negative for nausea, vomiting,  abdominal pain and diarrhea.  Genitourinary: Positive for flank pain. Negative for dysuria and difficulty urinating.  Musculoskeletal: Negative for arthralgias and neck pain.  Skin: Negative for color change.  Neurological: Negative for dizziness and weakness.  Psychiatric/Behavioral: Negative for dysphoric mood.      Allergies  Review of patient's allergies indicates no known allergies.  Home Medications   Prior to Admission medications   Medication Sig Start Date End Date Taking? Authorizing Provider  cetirizine (ZYRTEC) 10 MG tablet Take 10 mg by mouth at bedtime.   Yes Historical Provider, MD  ciprofloxacin (CIPRO) 500 MG tablet Take 500 mg by mouth 2 (two) times daily. Patient has 1 dose remaining patient started on 03/29/14   Yes Historical Provider, MD  ferrous sulfate 325 (65 FE) MG EC tablet Take 325 mg by mouth 2 (two) times daily.   Yes Historical Provider, MD  gabapentin (NEURONTIN) 300 MG capsule Take 300 mg by mouth 4 (four) times daily.   Yes Historical Provider, MD  HYDROcodone-acetaminophen (NORCO/VICODIN) 5-325 MG per tablet Take 1-2 tablets by mouth every 6 (six) hours as needed for moderate pain. 03/29/14  Yes Lavonna Monarch, MD  lisinopril (PRINIVIL,ZESTRIL) 20 MG tablet Take 0.5 tablets (10 mg total) by mouth daily. 09/15/13  Yes Megan Dort, PA-C  Magnesium Oxide 420 MG TABS Take 420 mg by mouth 2 (two) times daily.   Yes Historical Provider, MD  methocarbamol (ROBAXIN) 500 MG tablet Take 2 tablets (1,000 mg total) by mouth every 8 (eight) hours as needed for muscle spasms. 09/15/13  Yes Megan Dort, PA-C  mirtazapine (REMERON) 15 MG tablet Take 15 mg by mouth at  bedtime.   Yes Historical Provider, MD  omeprazole (PRILOSEC) 20 MG capsule Take 40 mg by mouth 2 (two) times daily before a meal.   Yes Historical Provider, MD  oxyCODONE (OXY IR/ROXICODONE) 5 MG immediate release tablet Take 1-2 tablets (5-10 mg total) by mouth every 4 (four) hours as needed for severe pain.  09/15/13  Yes Megan Dort, PA-C  potassium chloride SA (K-DUR,KLOR-CON) 20 MEQ tablet Take 20 mEq by mouth 2 (two) times daily.   Yes Historical Provider, MD  prazosin (MINIPRESS) 1 MG capsule Take 1 mg by mouth at bedtime.   Yes Historical Provider, MD  risperiDONE (RISPERDAL) 2 MG tablet Take 1 mg by mouth 2 (two) times daily.   Yes Historical Provider, MD  tamsulosin (FLOMAX) 0.4 MG CAPS capsule Take 1 capsule (0.4 mg total) by mouth daily. 03/29/14  Yes Lavonna Monarch, MD  zolpidem (AMBIEN) 10 MG tablet Take 10 mg by mouth at bedtime as needed for sleep.   Yes Historical Provider, MD   BP 174/104  Pulse 78  Temp(Src) 98.3 F (36.8 C) (Oral)  Resp 22  SpO2 98% Physical Exam  Nursing note and vitals reviewed. Constitutional: He is oriented to person, place, and time. He appears well-developed and well-nourished. No distress.  HENT:  Head: Normocephalic and atraumatic.  Eyes: Conjunctivae are normal.  Neck: Normal range of motion.  Cardiovascular: Normal rate and regular rhythm.  Exam reveals no gallop and no friction rub.   No murmur heard. Pulmonary/Chest: Effort normal and breath sounds normal. He has no wheezes. He has no rales. He exhibits no tenderness.  Abdominal: Soft. There is tenderness.  Right abdominal tenderness to palpation. No focal tenderness or peritoneal signs.   Genitourinary:  No CVA tenderness to palpation.   Musculoskeletal: Normal range of motion.  Neurological: He is alert and oriented to person, place, and time. Coordination normal.  Speech is goal-oriented. Moves limbs without ataxia.   Skin: Skin is warm and dry.  Psychiatric: He has a normal mood and affect. His behavior is normal.    ED Course  Procedures (including critical care time) Labs Review Labs Reviewed  CBC WITH DIFFERENTIAL - Abnormal; Notable for the following:    Hemoglobin 10.0 (*)    HCT 32.4 (*)    MCV 72.8 (*)    MCH 22.5 (*)    RDW 16.6 (*)    All other components within normal  limits  BASIC METABOLIC PANEL - Abnormal; Notable for the following:    Sodium 136 (*)    Potassium 3.3 (*)    Glucose, Bld 132 (*)    Anion gap 16 (*)    All other components within normal limits  URINALYSIS, ROUTINE W REFLEX MICROSCOPIC - Abnormal; Notable for the following:    Color, Urine RED (*)    APPearance CLOUDY (*)    Hgb urine dipstick LARGE (*)    Protein, ur 100 (*)    Leukocytes, UA MODERATE (*)    All other components within normal limits  URINE MICROSCOPIC-ADD ON - Abnormal; Notable for the following:    Bacteria, UA FEW (*)    All other components within normal limits    Imaging Review US Renal  04/01/2014   CLINICAL DATA:  History of urinary tract stones ; suspect right-sided hydronephrosis  EXAM: RENAL/URINARY TRACT ULTRASOUND COMPLETE  COMPARISON:  CT scan of the abdomen and pelvis of March 29 2014  FINDINGS: Right Kidney:  Length: 12.2 cm. Echogenicity within normal limits. No  mass or hydronephrosis visualized. There is a 6mm midpole stone. A stent is present.  Left Kidney:  Length: 13.1 cm. Echogenicity within normal limits. No mass or hydronephrosis visualized.  Bladder:  Eight normal left ureteral jet is demonstrated. The stent is visible. No ureteral jet on the right is demonstrated peer  IMPRESSION: 1. There are bilateral kidney stones not producing obstruction. 2. There is a ureteral stent in place on the right.   Electronically Signed   By: David  [place]   On: 04/01/2014 10:50     EKG Interpretation None      MDM   Final diagnoses:  Right flank pain  History of ureter stent   7:13 AM Patient will have morphine and zofran for pain. Labs and urinalysis pending. Vitals stable and patient afebrile. Patient is obviously uncomfortable.   8:26 AM I spoke with Dr. Retta Dionesahlstedt who recommends renal US to rule out hydronephrosis. I will order this and call him back to discuss plan.   1:57 PM Patient's US shows no hydronephrosis or stent occlusion. Vitals stable  and patient afebrile. Dr. Retta Dionesahlstedt saw the patient who recommends toradol here and closer follow up with Urologist in the office. Patient can be discharged home with instructions to return with worsening or concerning symptoms.   Emilia BeckKaitlyn Zykeriah Mathia, PA-C 04/01/14 1358

## 2014-04-02 NOTE — Discharge Summary (Signed)
Agree with D/C note 

## 2014-04-02 NOTE — Discharge Summary (Signed)
Date of admission: 03/29/2014  Date of discharge: 04/02/2014  Admission diagnosis: Nephrolithiasis  Discharge diagnosis: same  Secondary diagnoses: none  History and Physical: For full details, please see admission history and physical.  Hospital Course: He was taken from ED to OR for cysto, right ureteroscopic stone extraction of distal stone and right ureteral stent for planned ESWL of proximal stone. He did well and discharged home from the PACU.  Laboratory values:  Recent Labs  04/01/14 0717  HGB 10.0*  HCT 32.4*    Recent Labs  04/01/14 0717  CREATININE 0.59    Disposition: Home  Discharge instruction: The patient was instructed to be ambulatory but told to refrain from heavy lifting, strenuous activity, or driving.   Discharge medications:    Medication List         cetirizine 10 MG tablet  Commonly known as:  ZYRTEC  Take 10 mg by mouth at bedtime.     ferrous sulfate 325 (65 FE) MG EC tablet  Take 325 mg by mouth 2 (two) times daily.     gabapentin 300 MG capsule  Commonly known as:  NEURONTIN  Take 300 mg by mouth 4 (four) times daily.     HYDROcodone-acetaminophen 5-325 MG per tablet  Commonly known as:  NORCO/VICODIN  Take 1-2 tablets by mouth every 6 (six) hours as needed for moderate pain.     lisinopril 20 MG tablet  Commonly known as:  PRINIVIL,ZESTRIL  Take 0.5 tablets (10 mg total) by mouth daily.     Magnesium Oxide 420 MG Tabs  Take 420 mg by mouth 2 (two) times daily.     methocarbamol 500 MG tablet  Commonly known as:  ROBAXIN  Take 2 tablets (1,000 mg total) by mouth every 8 (eight) hours as needed for muscle spasms.     mirtazapine 15 MG tablet  Commonly known as:  REMERON  Take 15 mg by mouth at bedtime.     omeprazole 20 MG capsule  Commonly known as:  PRILOSEC  Take 40 mg by mouth 2 (two) times daily before a meal.     oxyCODONE 5 MG immediate release tablet  Commonly known as:  Oxy IR/ROXICODONE  Take 1-2 tablets (5-10  mg total) by mouth every 4 (four) hours as needed for severe pain.     potassium chloride SA 20 MEQ tablet  Commonly known as:  K-DUR,KLOR-CON  Take 20 mEq by mouth 2 (two) times daily.     prazosin 1 MG capsule  Commonly known as:  MINIPRESS  Take 1 mg by mouth at bedtime.     risperiDONE 2 MG tablet  Commonly known as:  RISPERDAL  Take 1 mg by mouth 2 (two) times daily.     tamsulosin 0.4 MG Caps capsule  Commonly known as:  FLOMAX  Take 1 capsule (0.4 mg total) by mouth daily.     zolpidem 10 MG tablet  Commonly known as:  AMBIEN  Take 10 mg by mouth at bedtime as needed for sleep.        Followup:  With Dr. Patsi Searsannenbaum next week for discussion and scheduling of ESWL.

## 2014-04-04 NOTE — ED Provider Notes (Signed)
Medical screening examination/treatment/procedure(s) were performed by non-physician practitioner and as supervising physician I was immediately available for consultation/collaboration.   EKG Interpretation None        Laray AngerKathleen M Cowen Pesqueira, DO 04/04/14 [phone]

## 2014-04-17 ENCOUNTER — Emergency Department (HOSPITAL_COMMUNITY): Payer: Non-veteran care

## 2014-04-17 ENCOUNTER — Emergency Department (HOSPITAL_COMMUNITY)
Admission: EM | Admit: 2014-04-17 | Discharge: 2014-04-17 | Disposition: A | Discharge status: Home / Self Care | Payer: Non-veteran care | Attending: Emergency Medicine | Admitting: Emergency Medicine

## 2014-04-17 ENCOUNTER — Encounter (HOSPITAL_COMMUNITY): Payer: Self-pay | Admitting: Emergency Medicine

## 2014-04-17 DIAGNOSIS — Z792 Long term (current) use of antibiotics: Secondary | ICD-10-CM | POA: Diagnosis not present

## 2014-04-17 DIAGNOSIS — F3289 Other specified depressive episodes: Secondary | ICD-10-CM | POA: Diagnosis not present

## 2014-04-17 DIAGNOSIS — S61409A Unspecified open wound of unspecified hand, initial encounter: Secondary | ICD-10-CM | POA: Diagnosis not present

## 2014-04-17 DIAGNOSIS — Z23 Encounter for immunization: Secondary | ICD-10-CM | POA: Diagnosis not present

## 2014-04-17 DIAGNOSIS — Y939 Activity, unspecified: Secondary | ICD-10-CM | POA: Diagnosis not present

## 2014-04-17 DIAGNOSIS — F1010 Alcohol abuse, uncomplicated: Secondary | ICD-10-CM | POA: Insufficient documentation

## 2014-04-17 DIAGNOSIS — IMO0002 Reserved for concepts with insufficient information to code with codable children: Secondary | ICD-10-CM

## 2014-04-17 DIAGNOSIS — W19XXXA Unspecified fall, initial encounter: Secondary | ICD-10-CM

## 2014-04-17 DIAGNOSIS — I10 Essential (primary) hypertension: Secondary | ICD-10-CM | POA: Insufficient documentation

## 2014-04-17 DIAGNOSIS — R109 Unspecified abdominal pain: Secondary | ICD-10-CM | POA: Insufficient documentation

## 2014-04-17 DIAGNOSIS — R296 Repeated falls: Secondary | ICD-10-CM | POA: Diagnosis not present

## 2014-04-17 DIAGNOSIS — Z79899 Other long term (current) drug therapy: Secondary | ICD-10-CM | POA: Insufficient documentation

## 2014-04-17 DIAGNOSIS — F17200 Nicotine dependence, unspecified, uncomplicated: Secondary | ICD-10-CM | POA: Diagnosis not present

## 2014-04-17 DIAGNOSIS — Y929 Unspecified place or not applicable: Secondary | ICD-10-CM | POA: Insufficient documentation

## 2014-04-17 DIAGNOSIS — F329 Major depressive disorder, single episode, unspecified: Secondary | ICD-10-CM | POA: Insufficient documentation

## 2014-04-17 DIAGNOSIS — F10920 Alcohol use, unspecified with intoxication, uncomplicated: Secondary | ICD-10-CM

## 2014-04-17 LAB — URINALYSIS, ROUTINE W REFLEX MICROSCOPIC
Bilirubin Urine: NEGATIVE
Glucose, UA: NEGATIVE mg/dL
Ketones, ur: NEGATIVE mg/dL
NITRITE: NEGATIVE
PH: 6 (ref 5.0–8.0)
Protein, ur: 100 mg/dL — AB
SPECIFIC GRAVITY, URINE: 1.015 (ref 1.005–1.030)
Urobilinogen, UA: 0.2 mg/dL (ref 0.0–1.0)

## 2014-04-17 LAB — CBC WITH DIFFERENTIAL/PLATELET
BASOS PCT: 0 % (ref 0–1)
Basophils Absolute: 0 10*3/uL (ref 0.0–0.1)
Eosinophils Absolute: 0.1 10*3/uL (ref 0.0–0.7)
Eosinophils Relative: 1 % (ref 0–5)
HEMATOCRIT: 31 % — AB (ref 39.0–52.0)
HEMOGLOBIN: 9.6 g/dL — AB (ref 13.0–17.0)
LYMPHS ABS: 1.7 10*3/uL (ref 0.7–4.0)
Lymphocytes Relative: 21 % (ref 12–46)
MCH: 22.1 pg — AB (ref 26.0–34.0)
MCHC: 31 g/dL (ref 30.0–36.0)
MCV: 71.4 fL — ABNORMAL LOW (ref 78.0–100.0)
MONO ABS: 0.8 10*3/uL (ref 0.1–1.0)
MONOS PCT: 10 % (ref 3–12)
Neutro Abs: 5.4 10*3/uL (ref 1.7–7.7)
Neutrophils Relative %: 68 % (ref 43–77)
Platelets: 355 10*3/uL (ref 150–400)
RBC: 4.34 MIL/uL (ref 4.22–5.81)
RDW: 15.9 % — ABNORMAL HIGH (ref 11.5–15.5)
WBC: 7.9 10*3/uL (ref 4.0–10.5)

## 2014-04-17 LAB — URINE MICROSCOPIC-ADD ON

## 2014-04-17 LAB — COMPREHENSIVE METABOLIC PANEL
ALBUMIN: 3.4 g/dL — AB (ref 3.5–5.2)
ALK PHOS: 112 U/L (ref 39–117)
ALT: 13 U/L (ref 0–53)
ANION GAP: 17 — AB (ref 5–15)
AST: 12 U/L (ref 0–37)
BUN: 15 mg/dL (ref 6–23)
CO2: 19 mEq/L (ref 19–32)
CREATININE: 0.76 mg/dL (ref 0.50–1.35)
Calcium: 9 mg/dL (ref 8.4–10.5)
Chloride: 98 mEq/L (ref 96–112)
GFR calc non Af Amer: >90 mL/min (ref >90)
GLUCOSE: 101 mg/dL — AB (ref 70–99)
POTASSIUM: 4 meq/L (ref 3.7–5.3)
Sodium: 134 mEq/L — ABNORMAL LOW (ref 137–147)
Total Bilirubin: <0.2 mg/dL — ABNORMAL LOW (ref 0.3–1.2)
Total Protein: 7 g/dL (ref 6.0–8.3)

## 2014-04-17 LAB — ETHANOL: Alcohol, Ethyl (B): 175 mg/dL — ABNORMAL HIGH (ref 0–11)

## 2014-04-17 LAB — LIPASE, BLOOD: LIPASE: 26 U/L (ref 11–59)

## 2014-04-17 MED ORDER — FENTANYL CITRATE 0.05 MG/ML IJ SOLN
100.0000 ug | Freq: Once | INTRAMUSCULAR | Status: AC
  Administered 2014-04-17: 100 ug via INTRAVENOUS
  Filled 2014-04-17: qty 2

## 2014-04-17 MED ORDER — SODIUM CHLORIDE 0.9 % IV BOLUS (SEPSIS)
1000.0000 mL | Freq: Once | INTRAVENOUS | Status: AC
  Administered 2014-04-17: 1000 mL via INTRAVENOUS

## 2014-04-17 MED ORDER — CEPHALEXIN 500 MG PO CAPS: 500.0000 mg | ORAL_CAPSULE | Freq: Four times a day (QID) | ORAL | Status: DC

## 2014-04-17 MED ORDER — ONDANSETRON HCL 4 MG/2ML IJ SOLN
4.0000 mg | Freq: Once | INTRAMUSCULAR | Status: AC
  Administered 2014-04-17: 4 mg via INTRAVENOUS
  Filled 2014-04-17: qty 2

## 2014-04-17 MED ORDER — TETANUS-DIPHTH-ACELL PERTUSSIS 5-2.5-18.5 LF-MCG/0.5 IM SUSP
0.5000 mL | Freq: Once | INTRAMUSCULAR | Status: AC
  Administered 2014-04-17: 0.5 mL via INTRAMUSCULAR
  Filled 2014-04-17: qty 0.5

## 2014-04-17 MED ORDER — LIDOCAINE HCL (PF) 1 % IJ SOLN
5.0000 mL | Freq: Once | INTRAMUSCULAR | Status: AC
  Administered 2014-04-17: 5 mL via INTRADERMAL
  Filled 2014-04-17: qty 5

## 2014-04-17 NOTE — ED Provider Notes (Signed)
CSN: [id number]     Arrival date & time 04/17/14  1821 History   First MD Initiated Contact with Patient 04/17/14 1830     Chief Complaint  Patient presents with  . Fall  . Flank Pain  . Extremity Laceration     (Consider location/radiation/quality/duration/timing/severity/associated sxs/prior Treatment) HPI Comments: Patient is a 53 year old male with history of hypertension and depression who presents to the emergency department today after a fall. He was sitting on a barstool when he had sudden onset right sided flank pain. It is an intermittent sharp pain which feels like prior kidney stones. He had a stent placed by Dr. Patsi Sears 2 weeks ago. He reports he drank 3 rum and cokes. He does not drink daily. He reports drinking every other weekend. He has a laceration to his left hand. He states "it's just a knick". Unsure of last tetanus shot.   Patient is a 53 y.o. male presenting with fall and flank pain. The history is provided by the patient. No language interpreter was used.  Fall Pertinent negatives include no chest pain, chills, fever or vomiting.  Flank Pain Pertinent negatives include no chest pain, chills, fever or vomiting.    Past Medical History  Diagnosis Date  . Hypertension   . Depression     PTSD per mother   Past Surgical History  Procedure Laterality Date  . Cystoscopy/retrograde/ureteroscopy/stone extraction with basket Right 03/29/2014    Procedure: CYSTOSCOPY/RETROGRADE/URETEROSCOPY/STONE EXTRACTION WITH BASKET, STENT PLACEMENT;  Surgeon: Kathi Ludwig, MD;  Location: [facility];  Service: Urology;  Laterality: Right;   No family history on file. History  Substance Use Topics  . Smoking status: Light Tobacco Smoker    Types: Cigarettes  . Smokeless tobacco: Not on file  . Alcohol Use: Yes     Comment: few times a week    Review of Systems  Constitutional: Negative for fever and chills.  Respiratory: Negative for shortness of breath.     Cardiovascular: Negative for chest pain.  Gastrointestinal: Negative for vomiting.  Genitourinary: Positive for flank pain.  Skin: Positive for wound.  All other systems reviewed and are negative.     Allergies  Review of patient's allergies indicates no known allergies.  Home Medications   Prior to Admission medications   Medication Sig Start Date End Date Taking? Authorizing Provider  cetirizine (ZYRTEC) 10 MG tablet Take 10 mg by mouth at bedtime.    Historical Provider, MD  ciprofloxacin (CIPRO) 500 MG tablet Take 500 mg by mouth 2 (two) times daily. Patient has 1 dose remaining patient started on 03/29/14    Historical Provider, MD  ferrous sulfate 325 (65 FE) MG EC tablet Take 325 mg by mouth 2 (two) times daily.    Historical Provider, MD  gabapentin (NEURONTIN) 300 MG capsule Take 300 mg by mouth 4 (four) times daily.    Historical Provider, MD  HYDROcodone-acetaminophen (NORCO/VICODIN) 5-325 MG per tablet Take 1-2 tablets by mouth every 6 (six) hours as needed for moderate pain. 03/29/14   Lavonna Monarch, MD  lisinopril (PRINIVIL,ZESTRIL) 20 MG tablet Take 0.5 tablets (10 mg total) by mouth daily. 09/15/13   Megan Dort, PA-C  Magnesium Oxide 420 MG TABS Take 420 mg by mouth 2 (two) times daily.    Historical Provider, MD  methocarbamol (ROBAXIN) 500 MG tablet Take 2 tablets (1,000 mg total) by mouth every 8 (eight) hours as needed for muscle spasms. 09/15/13   Megan Dort, PA-C  mirtazapine (REMERON) 15  MG tablet Take 15 mg by mouth at bedtime.    Historical Provider, MD  omeprazole (PRILOSEC) 20 MG capsule Take 40 mg by mouth 2 (two) times daily before a meal.    Historical Provider, MD  oxyCODONE (OXY IR/ROXICODONE) 5 MG immediate release tablet Take 1-2 tablets (5-10 mg total) by mouth every 4 (four) hours as needed for severe pain. 09/15/13   Megan Dort, PA-C  oxyCODONE-acetaminophen (PERCOCET/ROXICET) 5-325 MG per tablet Take 1-2 tablets by mouth every 4 (four) hours as needed  for moderate pain or severe pain. 04/01/14   Kaitlyn Szekalski, PA-C  potassium chloride SA (K-DUR,KLOR-CON) 20 MEQ tablet Take 20 mEq by mouth 2 (two) times daily.    Historical Provider, MD  prazosin (MINIPRESS) 1 MG capsule Take 1 mg by mouth at bedtime.    Historical Provider, MD  risperiDONE (RISPERDAL) 2 MG tablet Take 1 mg by mouth 2 (two) times daily.    Historical Provider, MD  tamsulosin (FLOMAX) 0.4 MG CAPS capsule Take 1 capsule (0.4 mg total) by mouth daily. 03/29/14   Lavonna Monarch, MD  zolpidem (AMBIEN) 10 MG tablet Take 10 mg by mouth at bedtime as needed for sleep.    Historical Provider, MD   BP 115/68  Pulse 95  Temp(Src) 97.8 F (36.6 C) (Oral)  Resp 18  SpO2 96% Physical Exam  Nursing note and vitals reviewed. Constitutional: He is oriented to person, place, and time. He appears well-developed and well-nourished. He does not appear ill. No distress.  HENT:  Head: Normocephalic and atraumatic.  Right Ear: External ear normal.  Left Ear: External ear normal.  Nose: Nose normal.  No mastoid tenderness.   Eyes: Conjunctivae are normal.  Neck: Normal range of motion. No tracheal deviation present.  Cardiovascular: Normal rate, regular rhythm and normal heart sounds.   Pulmonary/Chest: Effort normal and breath sounds normal. No stridor.  Abdominal: Soft. He exhibits no distension. There is tenderness. There is no CVA tenderness.    Musculoskeletal: Normal range of motion.  Neurological: He is alert and oriented to person, place, and time.  Skin: Skin is warm and dry. He is not diaphoretic.  2 cm laceration to left hand between thumb and index finger. Bleeding controlled.   Psychiatric: He has a normal mood and affect. His behavior is normal.    ED Course  Procedures (including critical care time) Labs Review Labs Reviewed  CBC WITH DIFFERENTIAL - Abnormal; Notable for the following:    Hemoglobin 9.6 (*)    HCT 31.0 (*)    MCV 71.4 (*)    MCH 22.1 (*)     RDW 15.9 (*)    All other components within normal limits  COMPREHENSIVE METABOLIC PANEL - Abnormal; Notable for the following:    Sodium 134 (*)    Glucose, Bld 101 (*)    Albumin 3.4 (*)    Total Bilirubin <0.2 (*)    Anion gap 17 (*)    All other components within normal limits  ETHANOL - Abnormal; Notable for the following:    Alcohol, Ethyl (B) 175 (*)    All other components within normal limits  URINALYSIS, ROUTINE W REFLEX MICROSCOPIC - Abnormal; Notable for the following:    Color, Urine AMBER (*)    APPearance TURBID (*)    Hgb urine dipstick LARGE (*)    Protein, ur 100 (*)    Leukocytes, UA MODERATE (*)    All other components within normal limits  URINE MICROSCOPIC-ADD ON -  Abnormal; Notable for the following:    Casts GRANULAR CAST (*)    All other components within normal limits  URINE CULTURE  LIPASE, BLOOD    Imaging Review  Ct Abdomen Pelvis Wo Contrast  03/29/2014   CLINICAL DATA:  Pain, hematuria.  EXAM: CT ABDOMEN AND PELVIS WITHOUT CONTRAST  TECHNIQUE: Multidetector CT imaging of the abdomen and pelvis was performed following the standard protocol without IV contrast.  COMPARISON:  Prior CT from 09/02/2013 and radiograph from 09/10/2013.  FINDINGS: The visualized lung bases are clear.  The liver demonstrates a normal unenhanced appearance. Gallbladder is surgically absent. No biliary ductal dilatation. The spleen, adrenal glands, and pancreas demonstrate a normal unenhanced appearance.  There is an obstructive 7 mm stone at the right UPJ with secondary moderate right hydronephrosis. Additional nonobstructive calculi measuring up to 5 mm present within the right kidney. Distally, there is an additional obstructive 8 mm calculus at the right UVJ. Moderate hydroureter is present. There is mild right perinephric and periureteral fat stranding.  On the left, multiple nonobstructive calculi measuring up to 1.2 cm is present. No obstructive stone seen along the course of  the left renal collecting system.  Stomach is normal. No evidence of bowel obstruction. No abnormal wall thickening or inflammatory stranding seen about the bowels.  Bladder within normal limits.  Prostate unremarkable.  Small fat containing inguinal hernias noted bilaterally.  No free air or fluid.  No adenopathy.  No acute osseous abnormality. Compression deformities of the T12, L1, and L2 vertebral bodies are grossly stable from prior radiograph. There is associated 8 mm of retropulsion at the L2 level. Remotely healed fractures of the left lateral eighth and ninth ribs noted. No worrisome lytic or blastic osseous lesions.  IMPRESSION: 1. Obstructive 8 mm calculus at the right UVJ with secondary moderate right hydroureteronephrosis. 2. Additional obstructive 7 mm right UPJ stone. 3. Additional bilateral nonobstructive nephrolithiasis as above. 4. Chronic compression deformities of the T12, L1, and L2 vertebral bodies, stable from prior radiograph from 09/10/2013.   Electronically Signed   By: Rise Mu M.D.   On: 03/29/2014 06:49   Dg Chest 2 View  03/29/2014   CLINICAL DATA:  Hematuria, hypertension, depression  EXAM: CHEST  2 VIEW  COMPARISON:  09/06/2013  FINDINGS: Stable cardiomegaly without CHF, pneumonia, collapse or consolidation. No edema, effusion or pneumothorax. Trachea midline. Degenerative changes of the spine. Compression fracture deformities at T12 through L2.  IMPRESSION: Cardiomegaly without acute chest process.   Electronically Signed   By: Ruel Favors M.D.   On: 03/29/2014 08:38   Ct Head Wo Contrast  04/17/2014   CLINICAL DATA:  Fall.  Headache.  Posterior neck pain.  EXAM: CT HEAD WITHOUT CONTRAST  CT CERVICAL SPINE WITHOUT CONTRAST  TECHNIQUE: Multidetector CT imaging of the head and cervical spine was performed following the standard protocol without intravenous contrast. Multiplanar CT image reconstructions of the cervical spine were also generated.  COMPARISON:   09/02/2013  FINDINGS: CT HEAD FINDINGS  Ventricles are normal configuration. There is ventricular and sulcal enlargement reflecting volume loss greater than typically seen for this patient's age. No parenchymal masses or mass effect. There is no evidence of an infarct. There are no extra-axial masses or abnormal fluid collections.  No intracranial hemorrhage.  Right maxillary sinus is mostly opacified. Mild polypoid coastal thickening in the floor of the left maxillary sinus. Remaining sinuses are clear. Multiple right mastoid air cells are filled with fluid attenuation material as is  the posterior right middle ear cavity. Clear left mastoid air cells.  No skull fracture.  CT CERVICAL SPINE FINDINGS  No fracture. No spondylolisthesis. There are no significant degenerative changes. No evidence of a disc herniation. Soft tissues are unremarkable. Lung apices are clear.  IMPRESSION: HEAD CT: No acute intracranial abnormalities. Atrophy advanced for age.  Right maxillary sinus is mostly opacified. This could reflect acute sinusitis. Multiple mastoid air cells on the right as well as a portion of the right middle ear cavity are opacified. This may be from chronic effusion, but acute mastoiditis should be considered in the proper clinical setting.  CERVICAL CT:  Normal.   Electronically Signed   By: Amie Portland M.D.   On: 04/17/2014 19:42   Ct Cervical Spine Wo Contrast  04/17/2014   CLINICAL DATA:  Fall.  Headache.  Posterior neck pain.  EXAM: CT HEAD WITHOUT CONTRAST  CT CERVICAL SPINE WITHOUT CONTRAST  TECHNIQUE: Multidetector CT imaging of the head and cervical spine was performed following the standard protocol without intravenous contrast. Multiplanar CT image reconstructions of the cervical spine were also generated.  COMPARISON:  09/02/2013  FINDINGS: CT HEAD FINDINGS  Ventricles are normal configuration. There is ventricular and sulcal enlargement reflecting volume loss greater than typically seen for this  patient's age. No parenchymal masses or mass effect. There is no evidence of an infarct. There are no extra-axial masses or abnormal fluid collections.  No intracranial hemorrhage.  Right maxillary sinus is mostly opacified. Mild polypoid coastal thickening in the floor of the left maxillary sinus. Remaining sinuses are clear. Multiple right mastoid air cells are filled with fluid attenuation material as is the posterior right middle ear cavity. Clear left mastoid air cells.  No skull fracture.  CT CERVICAL SPINE FINDINGS  No fracture. No spondylolisthesis. There are no significant degenerative changes. No evidence of a disc herniation. Soft tissues are unremarkable. Lung apices are clear.  IMPRESSION: HEAD CT: No acute intracranial abnormalities. Atrophy advanced for age.  Right maxillary sinus is mostly opacified. This could reflect acute sinusitis. Multiple mastoid air cells on the right as well as a portion of the right middle ear cavity are opacified. This may be from chronic effusion, but acute mastoiditis should be considered in the proper clinical setting.  CERVICAL CT:  Normal.   Electronically Signed   By: Amie Portland M.D.   On: 04/17/2014 19:42   US Renal  04/01/2014   CLINICAL DATA:  History of urinary tract stones ; suspect right-sided hydronephrosis  EXAM: RENAL/URINARY TRACT ULTRASOUND COMPLETE  COMPARISON:  CT scan of the abdomen and pelvis of March 29 2014  FINDINGS: Right Kidney:  Length: 12.2 cm. Echogenicity within normal limits. No mass or hydronephrosis visualized. There is a 6mm midpole stone. A stent is present.  Left Kidney:  Length: 13.1 cm. Echogenicity within normal limits. No mass or hydronephrosis visualized.  Bladder:  Eight normal left ureteral jet is demonstrated. The stent is visible. No ureteral jet on the right is demonstrated peer  IMPRESSION: 1. There are bilateral kidney stones not producing obstruction. 2. There is a ureteral stent in place on the right.   Electronically  Signed   By: David  [place]   On: 04/01/2014 10:50   Dg Hand Complete Left  04/17/2014   CLINICAL DATA:  Fall, hand laceration.  EXAM: LEFT HAND - COMPLETE 3+ VIEW  COMPARISON:  None.  FINDINGS: No acute bony abnormality. Specifically, no fracture, subluxation, or dislocation. Soft tissues are intact.  No radiopaque foreign bodies.  IMPRESSION: No acute bony abnormality.   Electronically Signed   By: Charlett NoseKevin  Dover M.D.   On: 04/17/2014 19:51     EKG Interpretation None      LACERATION REPAIR Performed by: Junious SilkMerrell, Yitta Gongaware Authorized by: Junious SilkMerrell, Sheril Hammond Consent: Verbal consent obtained. Risks and benefits: risks, benefits and alternatives were discussed Consent given by: patient Patient identity confirmed: provided demographic data Prepped and Draped in normal sterile fashion Wound explored  Laceration Location: left hand  Laceration Length: 2 cm  No Foreign Bodies seen or palpated  Anesthesia: local infiltration  Local anesthetic: lidocaine 2%   Anesthetic total: 3 ml  Irrigation method: syringe Amount of cleaning: standard  Skin closure: 5-0 ethilon  Number of sutures: 3  Technique: simple interrupted   Patient tolerance: Patient tolerated the procedure well with no immediate complications.   MDM   Final diagnoses:  Fall, initial encounter  Laceration  Flank pain  Alcohol intoxication, uncomplicated   Patient presents to ED after a fall from a barstool. He has laceration to his left hand. Repaired laceration and TDAP updated. Imaging otherwise unremarkable. Patient with right sided flank pain which has resolved during his ED stay. Patient with recent stent placed by Dr. Patsi Searsannenbaum. Moderate leukocytes in urine, but rare bacteria. Patient is afebrile. Will give Keflex. Patient to follow up with Dr. Patsi Searsannenbaum as an outpatient. Dicussed reasons to return to ED immediately. Vital signs stable for discharge. Discussed case with Dr. Lynelle DoctorKnapp who agrees with plan. Patient /  Family / Caregiver informed of clinical course, understand medical decision-making process, and agree with plan.   Mora BellmanHannah S Chea Malan, PA-C 04/27/14 [phone]

## 2014-04-17 NOTE — ED Notes (Signed)
Pt placed on 2L O2 after O2 sats dropping into 80's.

## 2014-04-17 NOTE — ED Notes (Signed)
PA at bedside suturing hand.

## 2014-04-17 NOTE — Discharge Instructions (Signed)
Keep your hand dry for the next 24 hours, after that you can wash it with soap and water. Return in 7 days for suture removal. Return sooner if you develop a fever, chills, redness to the surrounding area, or any drainage. Please follow up with your urologist for your flank pain. Return to the emergency department for any new or worsening symptoms.   Flank Pain Flank pain refers to pain that is located on the side of the body between the upper abdomen and the back. The pain may occur over a short period of time (acute) or may be long-term or reoccurring (chronic). It may be mild or severe. Flank pain can be caused by many things. CAUSES  Some of the more common causes of flank pain include:  Muscle strains.   Muscle spasms.   A disease of your spine (vertebral disk disease).   A lung infection (pneumonia).   Fluid around your lungs (pulmonary edema).   A kidney infection.   Kidney stones.   A very painful skin rash caused by the chickenpox virus (shingles).   Gallbladder disease.  HOME CARE INSTRUCTIONS  Home care will depend on the cause of your pain. In general,  Rest as directed by your caregiver.  Drink enough fluids to keep your urine clear or pale yellow.  Only take over-the-counter or prescription medicines as directed by your caregiver. Some medicines may help relieve the pain.  Tell your caregiver about any changes in your pain.  Follow up with your caregiver as directed. SEEK IMMEDIATE MEDICAL CARE IF:   Your pain is not controlled with medicine.   You have new or worsening symptoms.  Your pain increases.   You have abdominal pain.   You have shortness of breath.   You have persistent nausea or vomiting.   You have swelling in your abdomen.   You feel faint or pass out.   You have blood in your urine.  You have a fever or persistent symptoms for more than 2-3 days.  You have a fever and your symptoms suddenly get worse. MAKE SURE  YOU:   Understand these instructions.  Will watch your condition.  Will get help right away if you are not doing well or get worse. Document Released: 10/12/2005 Document Revised: 05/15/2012 Document Reviewed: 04/04/2012 [facility] Patient Information 2015 [place], [place]. This information is not intended to replace advice given to you by your health care provider. Make sure you discuss any questions you have with your health care provider.  Laceration Care, Adult A laceration is a cut or lesion that goes through all layers of the skin and into the tissue just beneath the skin. TREATMENT  Some lacerations may not require closure. Some lacerations may not be able to be closed due to an increased risk of infection. It is important to see your caregiver as soon as possible after an injury to minimize the risk of infection and maximize the opportunity for successful closure. If closure is appropriate, pain medicines may be given, if needed. The wound will be cleaned to help prevent infection. Your caregiver will use stitches (sutures), staples, wound glue (adhesive), or skin adhesive strips to repair the laceration. These tools bring the skin edges together to allow for faster healing and a better cosmetic outcome. However, all wounds will heal with a scar. Once the wound has healed, scarring can be minimized by covering the wound with sunscreen during the day for 1 full year. HOME CARE INSTRUCTIONS  For sutures or  staples:  Keep the wound clean and dry.  If you were given a bandage (dressing), you should change it at least once a day. Also, change the dressing if it becomes wet or dirty, or as directed by your caregiver.  Wash the wound with soap and water 2 times a day. Rinse the wound off with water to remove all soap. Pat the wound dry with a clean towel.  After cleaning, apply a thin layer of the antibiotic ointment as recommended by your caregiver. This will help prevent infection and keep  the dressing from sticking.  You may shower as usual after the first 24 hours. Do not soak the wound in water until the sutures are removed.  Only take over-the-counter or prescription medicines for pain, discomfort, or fever as directed by your caregiver.  Get your sutures or staples removed as directed by your caregiver. For skin adhesive strips:  Keep the wound clean and dry.  Do not get the skin adhesive strips wet. You may bathe carefully, using caution to keep the wound dry.  If the wound gets wet, pat it dry with a clean towel.  Skin adhesive strips will fall off on their own. You may trim the strips as the wound heals. Do not remove skin adhesive strips that are still stuck to the wound. They will fall off in time. For wound adhesive:  You may briefly wet your wound in the shower or bath. Do not soak or scrub the wound. Do not swim. Avoid periods of heavy perspiration until the skin adhesive has fallen off on its own. After showering or bathing, gently pat the wound dry with a clean towel.  Do not apply liquid medicine, cream medicine, or ointment medicine to your wound while the skin adhesive is in place. This may loosen the film before your wound is healed.  If a dressing is placed over the wound, be careful not to apply tape directly over the skin adhesive. This may cause the adhesive to be pulled off before the wound is healed.  Avoid prolonged exposure to sunlight or tanning lamps while the skin adhesive is in place. Exposure to ultraviolet light in the first year will darken the scar.  The skin adhesive will usually remain in place for 5 to 10 days, then naturally fall off the skin. Do not pick at the adhesive film. You may need a tetanus shot if:  You cannot remember when you had your last tetanus shot.  You have never had a tetanus shot. If you get a tetanus shot, your arm may swell, get red, and feel warm to the touch. This is common and not a problem. If you need a  tetanus shot and you choose not to have one, there is a rare chance of getting tetanus. Sickness from tetanus can be serious. SEEK MEDICAL CARE IF:   You have redness, swelling, or increasing pain in the wound.  You see a red line that goes away from the wound.  You have yellowish-white fluid (pus) coming from the wound.  You have a fever.  You notice a bad smell coming from the wound or dressing.  Your wound breaks open before or after sutures have been removed.  You notice something coming out of the wound such as wood or glass.  Your wound is on your hand or foot and you cannot move a finger or toe. SEEK IMMEDIATE MEDICAL CARE IF:   Your pain is not controlled with prescribed medicine.  You  have severe swelling around the wound causing pain and numbness or a change in color in your arm, hand, leg, or foot.  Your wound splits open and starts bleeding.  You have worsening numbness, weakness, or loss of function of any joint around or beyond the wound.  You develop painful lumps near the wound or on the skin anywhere on your body. MAKE SURE YOU:   Understand these instructions.  Will watch your condition.  Will get help right away if you are not doing well or get worse. Document Released: 08/21/2005 Document Revised: 11/13/2011 Document Reviewed: 02/14/2011 [facility] Patient Information 2015 [place], [place]. This information is not intended to replace advice given to you by your health care provider. Make sure you discuss any questions you have with your health care provider.

## 2014-04-17 NOTE — ED Notes (Signed)
Bed: [id number] Expected date:  Expected time:  Means of arrival:  Comments: Larey SeatFell off bar stool

## 2014-04-17 NOTE — ED Notes (Signed)
Patient transported to X-ray and CT 

## 2014-04-17 NOTE — ED Notes (Signed)
Per EMS, Pt was drinking, 5 drinks and 2 shots plus pain medication. Pt got pain in RLQ and sts that is why he fell off bar stool. Pt does not remember falling off bar stool. Pt is repetitive. A&Ox4.

## 2014-04-19 LAB — URINE CULTURE
COLONY COUNT: NO GROWTH
Culture: NO GROWTH

## 2014-04-27 NOTE — ED Provider Notes (Signed)
Medical screening examination/treatment/procedure(s) were performed by non-physician practitioner and as supervising physician I was immediately available for consultation/collaboration.    Finesse Fielder, MD 04/27/14 1502 
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
## 2015-11-01 IMAGING — US US RENAL
1 series · 14 of 25 positions shown · non-contrast
Comparison: CT scan of the abdomen and pelvis of March 29, 2014

CLINICAL DATA: History of urinary tract stones ; suspect
right-sided hydronephrosis

EXAM:
RENAL/URINARY TRACT ULTRASOUND COMPLETE

[Series 1: us renal · 0.22mm/px · 14 of 50 slices shown]
[im 1/50]
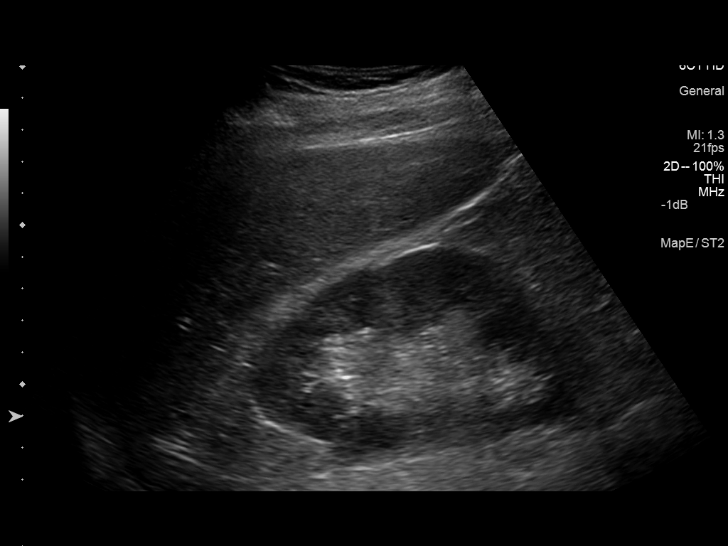
[im 5/50]
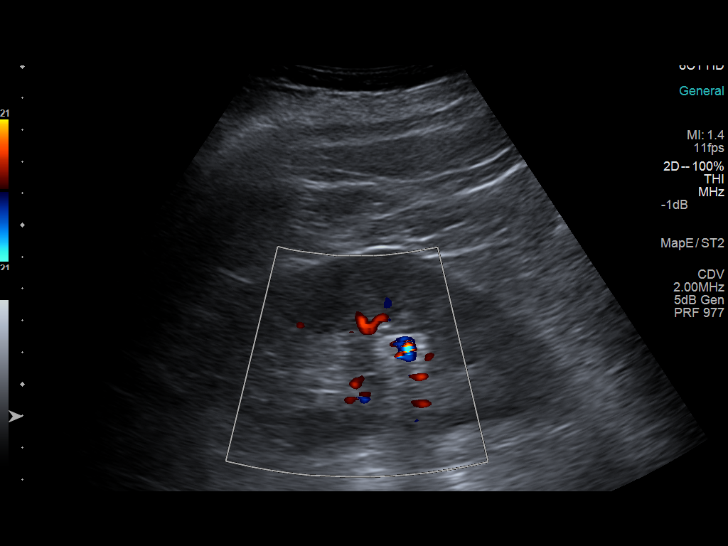
[im 9/50]
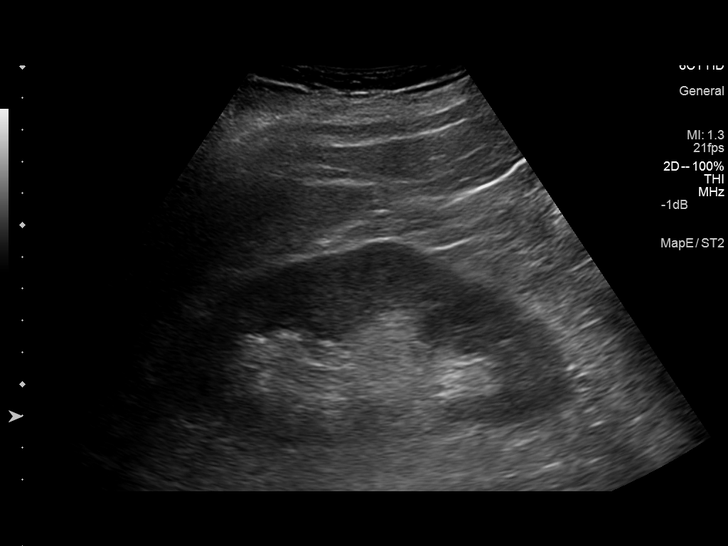
[im 13/50]
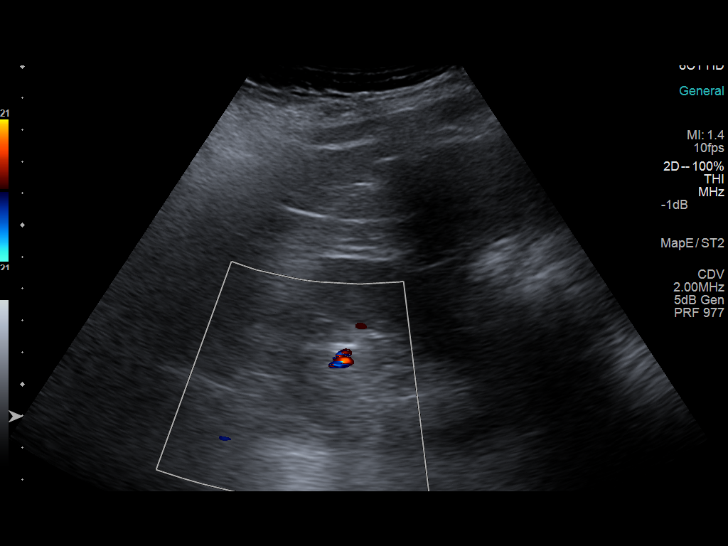
[im 17/50]
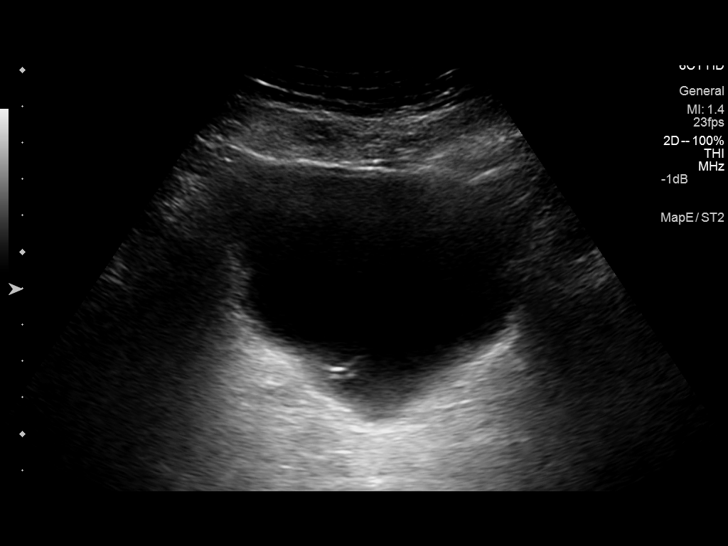
[im 19/50]
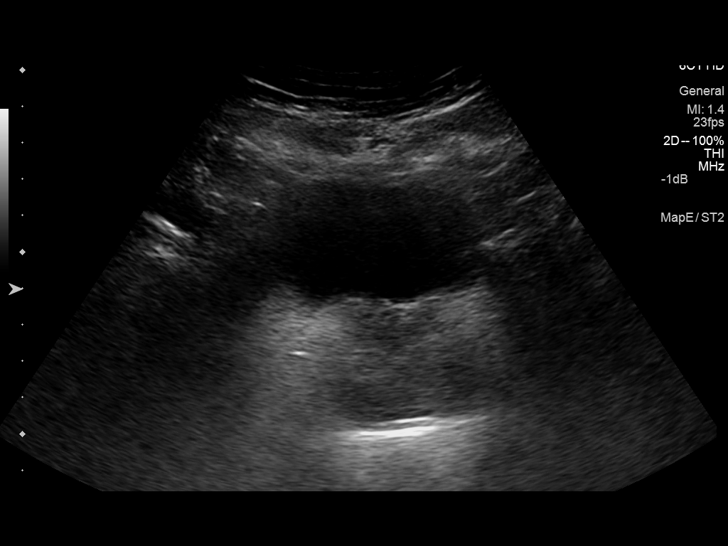
[im 23/50]
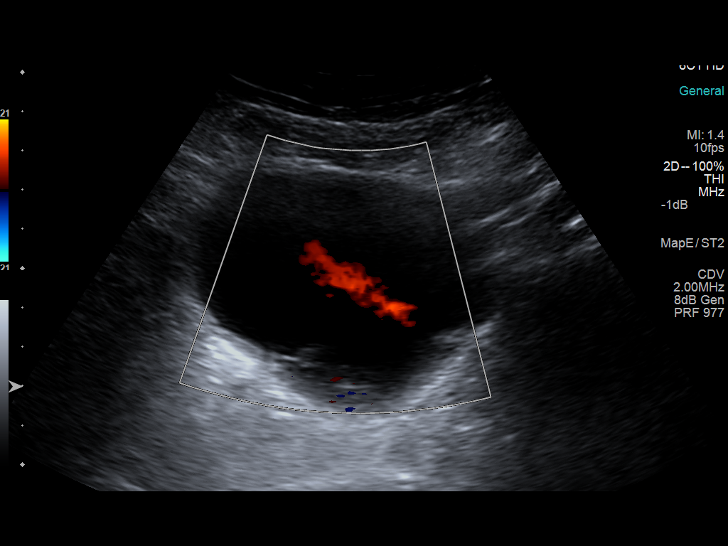
[im 27/50]
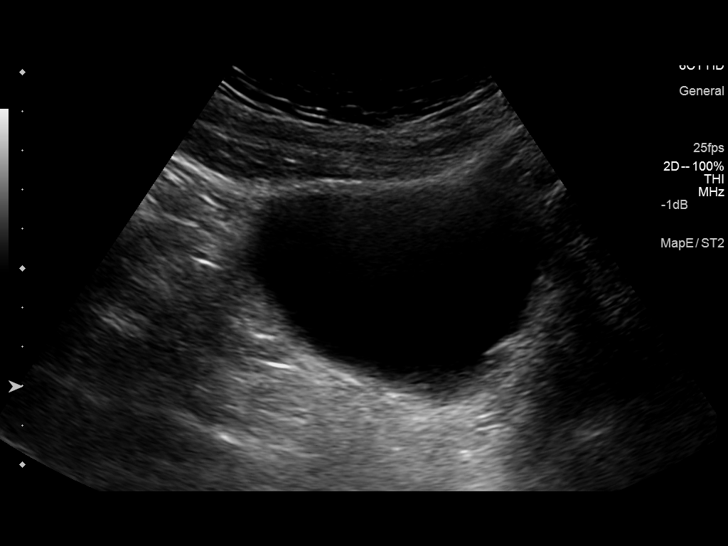
[im 31/50]
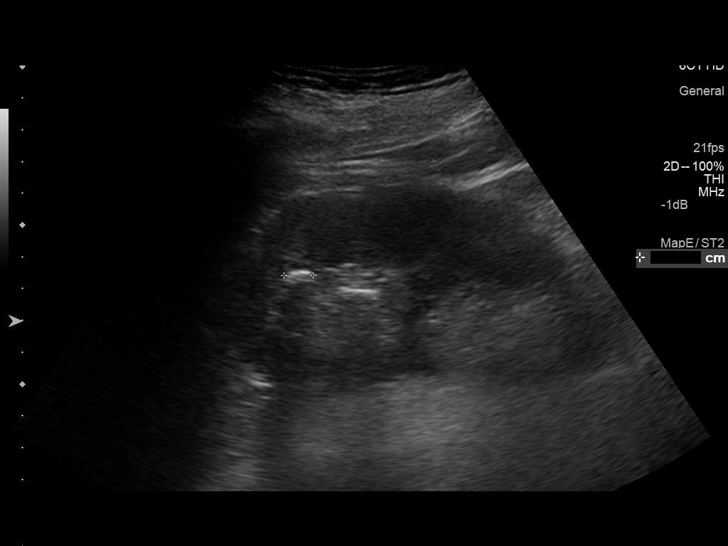
[im 33/50]
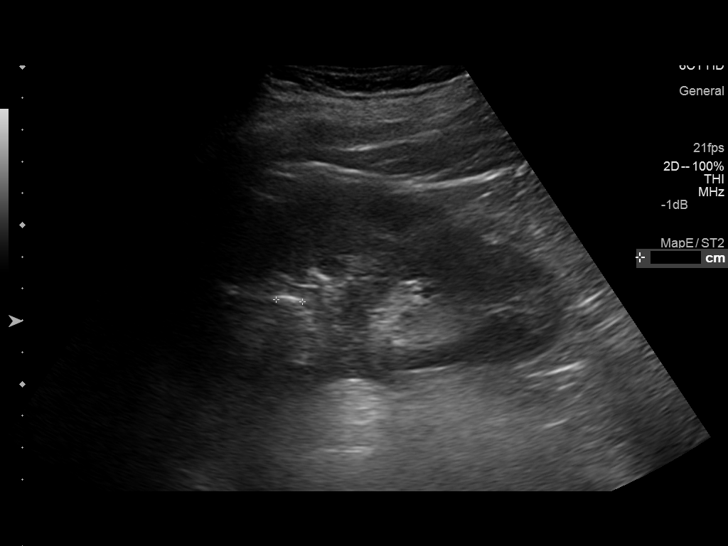
[im 37/50]
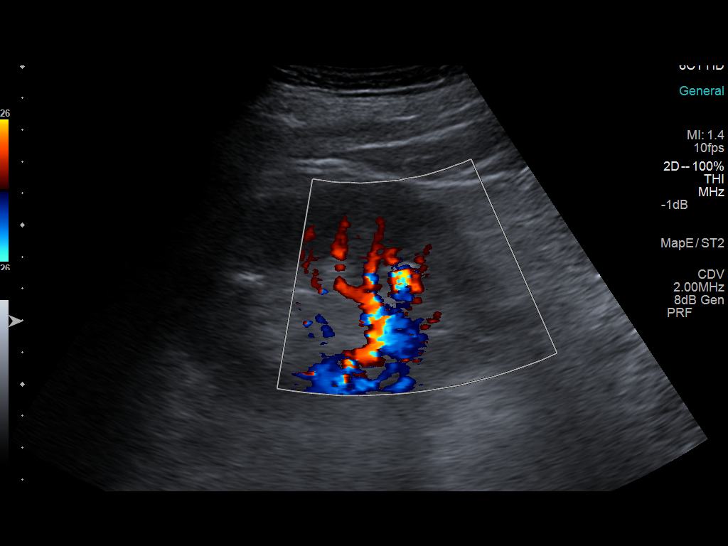
[im 41/50]
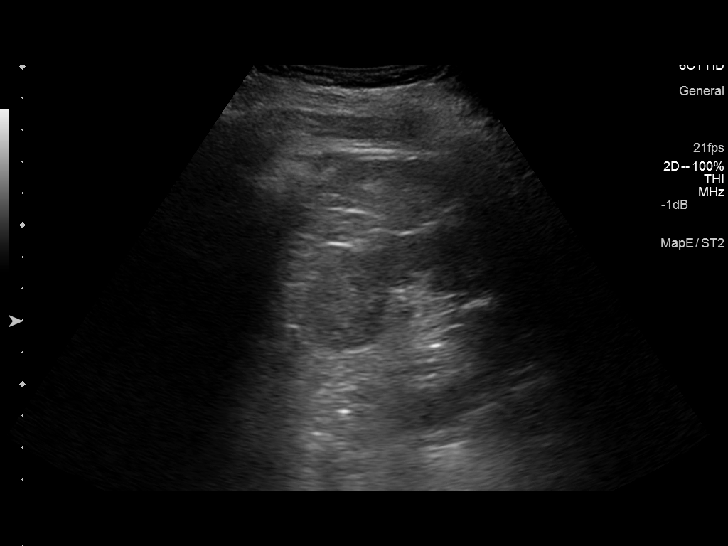
[im 45/50]
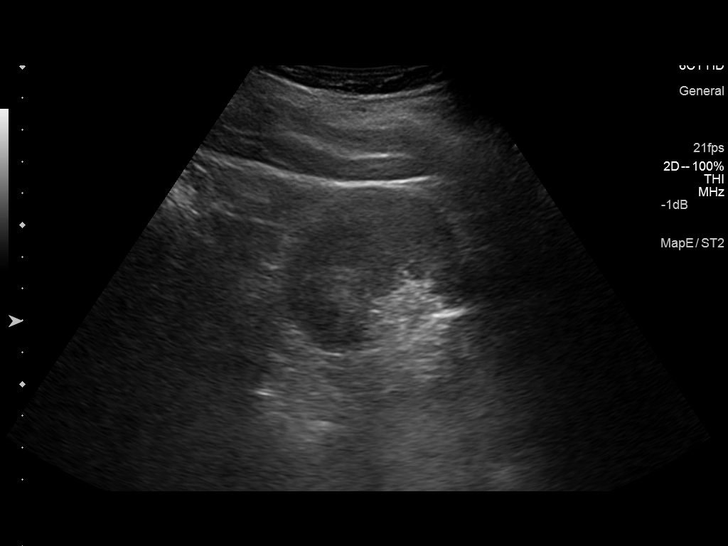
[im 50/50]
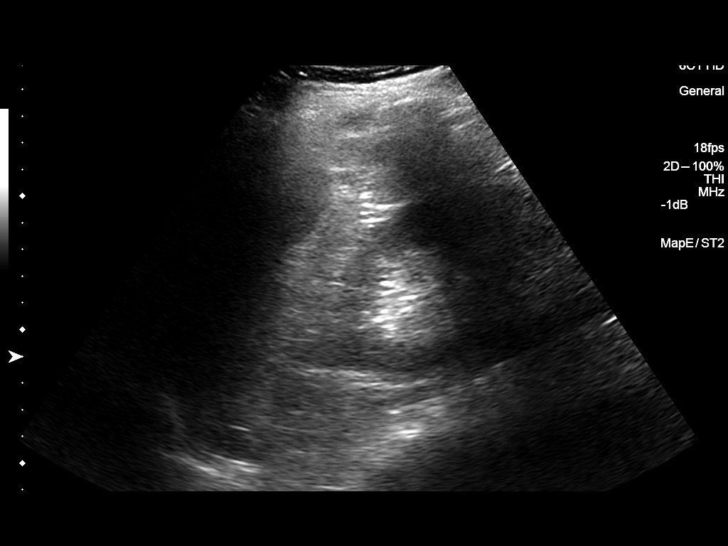

[14 of 25 positions shown; findings below may reference images not displayed]

FINDINGS: Right Kidney:

Length: 12.2 cm. Echogenicity within normal limits. No mass or
hydronephrosis visualized. There is a 6mm midpole stone. A stent is
present.

Left Kidney:

Length: 13.1 cm.. Echogenicity within normal limits. No mass or
hydronephrosis visualized.

Bladder:

Eight normal left ureteral jet is demonstrated. The stent is
visible. No ureteral jet on the right is demonstrated peer
IMPRESSION: 1. There are bilateral kidney stones not producing obstruction.
2. There is a ureteral stent in place on the right.

## 2018-05-12 ENCOUNTER — Emergency Department (HOSPITAL_COMMUNITY): Payer: No Typology Code available for payment source

## 2018-05-12 ENCOUNTER — Encounter (HOSPITAL_COMMUNITY): Payer: Self-pay | Admitting: Emergency Medicine

## 2018-05-12 ENCOUNTER — Inpatient Hospital Stay (HOSPITAL_COMMUNITY)
Admission: EM | Admit: 2018-05-12 | Discharge: 2018-05-16 | DRG: 853 | Disposition: A | Discharge status: Home / Self Care | Payer: No Typology Code available for payment source | Attending: Internal Medicine | Admitting: Internal Medicine

## 2018-05-12 DIAGNOSIS — Z720 Tobacco use: Secondary | ICD-10-CM | POA: Diagnosis present

## 2018-05-12 DIAGNOSIS — Z419 Encounter for procedure for purposes other than remedying health state, unspecified: Secondary | ICD-10-CM

## 2018-05-12 DIAGNOSIS — E119 Type 2 diabetes mellitus without complications: Secondary | ICD-10-CM | POA: Diagnosis present

## 2018-05-12 DIAGNOSIS — F32A Depression, unspecified: Secondary | ICD-10-CM | POA: Diagnosis present

## 2018-05-12 DIAGNOSIS — E872 Acidosis: Secondary | ICD-10-CM | POA: Diagnosis present

## 2018-05-12 DIAGNOSIS — Z833 Family history of diabetes mellitus: Secondary | ICD-10-CM

## 2018-05-12 DIAGNOSIS — K2970 Gastritis, unspecified, without bleeding: Secondary | ICD-10-CM | POA: Diagnosis present

## 2018-05-12 DIAGNOSIS — I10 Essential (primary) hypertension: Secondary | ICD-10-CM | POA: Diagnosis present

## 2018-05-12 DIAGNOSIS — K219 Gastro-esophageal reflux disease without esophagitis: Secondary | ICD-10-CM | POA: Diagnosis present

## 2018-05-12 DIAGNOSIS — M488X4 Other specified spondylopathies, thoracic region: Secondary | ICD-10-CM | POA: Diagnosis present

## 2018-05-12 DIAGNOSIS — K210 Gastro-esophageal reflux disease with esophagitis: Secondary | ICD-10-CM | POA: Diagnosis present

## 2018-05-12 DIAGNOSIS — F329 Major depressive disorder, single episode, unspecified: Secondary | ICD-10-CM | POA: Diagnosis present

## 2018-05-12 DIAGNOSIS — M488X6 Other specified spondylopathies, lumbar region: Secondary | ICD-10-CM | POA: Diagnosis present

## 2018-05-12 DIAGNOSIS — F4310 Post-traumatic stress disorder, unspecified: Secondary | ICD-10-CM | POA: Diagnosis present

## 2018-05-12 DIAGNOSIS — K920 Hematemesis: Secondary | ICD-10-CM | POA: Diagnosis present

## 2018-05-12 DIAGNOSIS — N400 Enlarged prostate without lower urinary tract symptoms: Secondary | ICD-10-CM | POA: Diagnosis present

## 2018-05-12 DIAGNOSIS — R1310 Dysphagia, unspecified: Secondary | ICD-10-CM | POA: Diagnosis present

## 2018-05-12 DIAGNOSIS — A419 Sepsis, unspecified organism: Secondary | ICD-10-CM | POA: Diagnosis not present

## 2018-05-12 DIAGNOSIS — E876 Hypokalemia: Secondary | ICD-10-CM | POA: Diagnosis present

## 2018-05-12 DIAGNOSIS — N390 Urinary tract infection, site not specified: Secondary | ICD-10-CM

## 2018-05-12 DIAGNOSIS — Z8261 Family history of arthritis: Secondary | ICD-10-CM

## 2018-05-12 DIAGNOSIS — R079 Chest pain, unspecified: Secondary | ICD-10-CM | POA: Diagnosis not present

## 2018-05-12 DIAGNOSIS — N132 Hydronephrosis with renal and ureteral calculous obstruction: Secondary | ICD-10-CM

## 2018-05-12 DIAGNOSIS — Z87891 Personal history of nicotine dependence: Secondary | ICD-10-CM

## 2018-05-12 DIAGNOSIS — N179 Acute kidney failure, unspecified: Secondary | ICD-10-CM | POA: Diagnosis present

## 2018-05-12 DIAGNOSIS — N10 Acute tubulo-interstitial nephritis: Secondary | ICD-10-CM | POA: Diagnosis present

## 2018-05-12 DIAGNOSIS — K226 Gastro-esophageal laceration-hemorrhage syndrome: Secondary | ICD-10-CM | POA: Diagnosis present

## 2018-05-12 DIAGNOSIS — N211 Calculus in urethra: Secondary | ICD-10-CM | POA: Diagnosis present

## 2018-05-12 DIAGNOSIS — Z23 Encounter for immunization: Secondary | ICD-10-CM

## 2018-05-12 DIAGNOSIS — K2211 Ulcer of esophagus with bleeding: Secondary | ICD-10-CM | POA: Diagnosis present

## 2018-05-12 DIAGNOSIS — E860 Dehydration: Secondary | ICD-10-CM | POA: Diagnosis present

## 2018-05-12 DIAGNOSIS — K267 Chronic duodenal ulcer without hemorrhage or perforation: Secondary | ICD-10-CM | POA: Diagnosis present

## 2018-05-12 DIAGNOSIS — N136 Pyonephrosis: Secondary | ICD-10-CM | POA: Diagnosis present

## 2018-05-12 LAB — URINALYSIS, ROUTINE W REFLEX MICROSCOPIC
Bilirubin Urine: NEGATIVE
GLUCOSE, UA: NEGATIVE mg/dL
KETONES UR: 80 mg/dL — AB
Leukocytes, UA: NEGATIVE
NITRITE: NEGATIVE
PROTEIN: 100 mg/dL — AB
Specific Gravity, Urine: 1.017 (ref 1.005–1.030)
pH: 6 (ref 5.0–8.0)

## 2018-05-12 LAB — CBC
HCT: 55.3 % — ABNORMAL HIGH (ref 39.0–52.0)
Hemoglobin: 17.9 g/dL — ABNORMAL HIGH (ref 13.0–17.0)
MCH: 28.2 pg (ref 26.0–34.0)
MCHC: 32.4 g/dL (ref 30.0–36.0)
MCV: 87.1 fL (ref 78.0–100.0)
Platelets: 432 10*3/uL — ABNORMAL HIGH (ref 150–400)
RBC: 6.35 MIL/uL — ABNORMAL HIGH (ref 4.22–5.81)
RDW: 14.2 % (ref 11.5–15.5)
WBC: 23.5 10*3/uL — ABNORMAL HIGH (ref 4.0–10.5)

## 2018-05-12 LAB — COMPREHENSIVE METABOLIC PANEL
ALT: 17 U/L (ref 0–44)
AST: 12 U/L — ABNORMAL LOW (ref 15–41)
Albumin: 4.4 g/dL (ref 3.5–5.0)
Alkaline Phosphatase: 92 U/L (ref 38–126)
Anion gap: 19 — ABNORMAL HIGH (ref 5–15)
BUN: 25 mg/dL — ABNORMAL HIGH (ref 6–20)
CO2: 9 mmol/L — AB (ref 22–32)
Calcium: 9.2 mg/dL (ref 8.9–10.3)
Chloride: 109 mmol/L (ref 98–111)
Creatinine, Ser: 1.79 mg/dL — ABNORMAL HIGH (ref 0.61–1.24)
GFR calc Af Amer: 47 mL/min — ABNORMAL LOW (ref >60)
GFR calc non Af Amer: 41 mL/min — ABNORMAL LOW (ref >60)
Glucose, Bld: 193 mg/dL — ABNORMAL HIGH (ref 70–99)
Potassium: 4.5 mmol/L (ref 3.5–5.1)
Sodium: 137 mmol/L (ref 135–145)
TOTAL PROTEIN: 7.4 g/dL (ref 6.5–8.1)
Total Bilirubin: 1.4 mg/dL — ABNORMAL HIGH (ref 0.3–1.2)

## 2018-05-12 LAB — LIPASE, BLOOD: Lipase: 31 U/L (ref 11–51)

## 2018-05-12 MED ORDER — MORPHINE SULFATE (PF) 4 MG/ML IV SOLN
4.0000 mg | Freq: Once | INTRAVENOUS | Status: AC
  Administered 2018-05-12: 4 mg via INTRAVENOUS
  Filled 2018-05-12: qty 1

## 2018-05-12 MED ORDER — PANTOPRAZOLE SODIUM 40 MG PO TBEC
40.0000 mg | DELAYED_RELEASE_TABLET | Freq: Every day | ORAL | Status: DC
  Administered 2018-05-12: 40 mg via ORAL
  Filled 2018-05-12: qty 1

## 2018-05-12 MED ORDER — IOPAMIDOL (ISOVUE-300) INJECTION 61%
INTRAVENOUS | Status: AC
  Filled 2018-05-12: qty 100

## 2018-05-12 MED ORDER — ONDANSETRON HCL 4 MG/2ML IJ SOLN
4.0000 mg | Freq: Once | INTRAMUSCULAR | Status: AC
  Administered 2018-05-12: 4 mg via INTRAVENOUS
  Filled 2018-05-12: qty 2

## 2018-05-12 MED ORDER — SODIUM CHLORIDE 0.9 % IV BOLUS
1000.0000 mL | Freq: Once | INTRAVENOUS | Status: AC
  Administered 2018-05-12: 1000 mL via INTRAVENOUS

## 2018-05-12 MED ORDER — IOPAMIDOL (ISOVUE-300) INJECTION 61%
80.0000 mL | Freq: Once | INTRAVENOUS | Status: AC | PRN
  Administered 2018-05-12: 100 mL via INTRAVENOUS

## 2018-05-12 NOTE — ED Notes (Signed)
EDP explained tests results and admission plan to pt. and family .

## 2018-05-12 NOTE — ED Triage Notes (Signed)
Per [facility]:  Patient in with 2 days of nausea, emesis after consumption, and dark/bloody emesis.  C/o generalized abdominal pain.  Denies constipation/diarrhea, denies changes in urination.  VSS en route.  CBG 147

## 2018-05-12 NOTE — ED Notes (Signed)
Pt transported to CT ?

## 2018-05-12 NOTE — ED Notes (Signed)
Nurse collecting labs. 
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
## 2018-05-13 ENCOUNTER — Encounter (HOSPITAL_COMMUNITY): Payer: Self-pay | Admitting: Internal Medicine

## 2018-05-13 ENCOUNTER — Inpatient Hospital Stay (HOSPITAL_COMMUNITY): Payer: No Typology Code available for payment source | Admitting: Anesthesiology

## 2018-05-13 ENCOUNTER — Encounter (HOSPITAL_COMMUNITY)
Admission: EM | Disposition: A | Discharge status: Home / Self Care | Payer: Self-pay | Source: Home / Self Care | Attending: Internal Medicine

## 2018-05-13 ENCOUNTER — Other Ambulatory Visit: Payer: Self-pay

## 2018-05-13 ENCOUNTER — Inpatient Hospital Stay (HOSPITAL_COMMUNITY): Payer: No Typology Code available for payment source

## 2018-05-13 DIAGNOSIS — A419 Sepsis, unspecified organism: Secondary | ICD-10-CM | POA: Diagnosis present

## 2018-05-13 DIAGNOSIS — K2211 Ulcer of esophagus with bleeding: Secondary | ICD-10-CM | POA: Diagnosis present

## 2018-05-13 DIAGNOSIS — Z87891 Personal history of nicotine dependence: Secondary | ICD-10-CM | POA: Diagnosis not present

## 2018-05-13 DIAGNOSIS — N136 Pyonephrosis: Secondary | ICD-10-CM | POA: Diagnosis present

## 2018-05-13 DIAGNOSIS — E119 Type 2 diabetes mellitus without complications: Secondary | ICD-10-CM | POA: Diagnosis not present

## 2018-05-13 DIAGNOSIS — F32A Depression, unspecified: Secondary | ICD-10-CM | POA: Diagnosis present

## 2018-05-13 DIAGNOSIS — F329 Major depressive disorder, single episode, unspecified: Secondary | ICD-10-CM | POA: Diagnosis present

## 2018-05-13 DIAGNOSIS — E876 Hypokalemia: Secondary | ICD-10-CM | POA: Diagnosis present

## 2018-05-13 DIAGNOSIS — Z8261 Family history of arthritis: Secondary | ICD-10-CM | POA: Diagnosis not present

## 2018-05-13 DIAGNOSIS — N10 Acute tubulo-interstitial nephritis: Secondary | ICD-10-CM | POA: Diagnosis present

## 2018-05-13 DIAGNOSIS — K219 Gastro-esophageal reflux disease without esophagitis: Secondary | ICD-10-CM | POA: Diagnosis present

## 2018-05-13 DIAGNOSIS — E860 Dehydration: Secondary | ICD-10-CM | POA: Diagnosis present

## 2018-05-13 DIAGNOSIS — F4310 Post-traumatic stress disorder, unspecified: Secondary | ICD-10-CM | POA: Diagnosis not present

## 2018-05-13 DIAGNOSIS — Z720 Tobacco use: Secondary | ICD-10-CM | POA: Diagnosis not present

## 2018-05-13 DIAGNOSIS — N132 Hydronephrosis with renal and ureteral calculous obstruction: Secondary | ICD-10-CM | POA: Diagnosis not present

## 2018-05-13 DIAGNOSIS — I10 Essential (primary) hypertension: Secondary | ICD-10-CM | POA: Diagnosis present

## 2018-05-13 DIAGNOSIS — E872 Acidosis: Secondary | ICD-10-CM | POA: Diagnosis present

## 2018-05-13 DIAGNOSIS — K267 Chronic duodenal ulcer without hemorrhage or perforation: Secondary | ICD-10-CM | POA: Diagnosis present

## 2018-05-13 DIAGNOSIS — M488X4 Other specified spondylopathies, thoracic region: Secondary | ICD-10-CM | POA: Diagnosis not present

## 2018-05-13 DIAGNOSIS — K226 Gastro-esophageal laceration-hemorrhage syndrome: Secondary | ICD-10-CM | POA: Diagnosis present

## 2018-05-13 DIAGNOSIS — R1310 Dysphagia, unspecified: Secondary | ICD-10-CM | POA: Diagnosis not present

## 2018-05-13 DIAGNOSIS — I5030 Unspecified diastolic (congestive) heart failure: Secondary | ICD-10-CM | POA: Diagnosis not present

## 2018-05-13 DIAGNOSIS — K920 Hematemesis: Secondary | ICD-10-CM | POA: Diagnosis present

## 2018-05-13 DIAGNOSIS — N179 Acute kidney failure, unspecified: Secondary | ICD-10-CM | POA: Diagnosis present

## 2018-05-13 DIAGNOSIS — R079 Chest pain, unspecified: Secondary | ICD-10-CM | POA: Diagnosis not present

## 2018-05-13 DIAGNOSIS — Z833 Family history of diabetes mellitus: Secondary | ICD-10-CM | POA: Diagnosis not present

## 2018-05-13 DIAGNOSIS — N211 Calculus in urethra: Secondary | ICD-10-CM | POA: Diagnosis not present

## 2018-05-13 DIAGNOSIS — N390 Urinary tract infection, site not specified: Secondary | ICD-10-CM

## 2018-05-13 DIAGNOSIS — K210 Gastro-esophageal reflux disease with esophagitis: Secondary | ICD-10-CM | POA: Diagnosis not present

## 2018-05-13 DIAGNOSIS — Z23 Encounter for immunization: Secondary | ICD-10-CM | POA: Diagnosis not present

## 2018-05-13 DIAGNOSIS — K2970 Gastritis, unspecified, without bleeding: Secondary | ICD-10-CM | POA: Diagnosis not present

## 2018-05-13 DIAGNOSIS — N400 Enlarged prostate without lower urinary tract symptoms: Secondary | ICD-10-CM | POA: Diagnosis not present

## 2018-05-13 HISTORY — PX: CYSTOSCOPY W/ URETERAL STENT PLACEMENT: SHX1429

## 2018-05-13 LAB — CBC
HCT: 43.5 % (ref 39.0–52.0)
HCT: 39.8 % (ref 39.0–52.0)
HCT: 38.7 % — ABNORMAL LOW (ref 39.0–52.0)
HEMATOCRIT: 46.4 % (ref 39.0–52.0)
Hemoglobin: 15.2 g/dL (ref 13.0–17.0)
Hemoglobin: 14.1 g/dL (ref 13.0–17.0)
Hemoglobin: 13 g/dL (ref 13.0–17.0)
Hemoglobin: 12.6 g/dL — ABNORMAL LOW (ref 13.0–17.0)
MCH: 28.3 pg (ref 26.0–34.0)
MCH: 28.1 pg (ref 26.0–34.0)
MCH: 28 pg (ref 26.0–34.0)
MCH: 27.9 pg (ref 26.0–34.0)
MCHC: 32.8 g/dL (ref 30.0–36.0)
MCHC: 32.4 g/dL (ref 30.0–36.0)
MCHC: 32.7 g/dL (ref 30.0–36.0)
MCHC: 32.6 g/dL (ref 30.0–36.0)
MCV: 86.4 fL (ref 78.0–100.0)
MCV: 86.8 fL (ref 78.0–100.0)
MCV: 85.8 fL (ref 78.0–100.0)
MCV: 85.6 fL (ref 78.0–100.0)
PLATELETS: 283 10*3/uL (ref 150–400)
PLATELETS: 243 10*3/uL (ref 150–400)
Platelets: 355 10*3/uL (ref 150–400)
Platelets: 248 10*3/uL (ref 150–400)
RBC: 5.37 MIL/uL (ref 4.22–5.81)
RBC: 5.01 MIL/uL (ref 4.22–5.81)
RBC: 4.64 MIL/uL (ref 4.22–5.81)
RBC: 4.52 MIL/uL (ref 4.22–5.81)
RDW: 14.3 % (ref 11.5–15.5)
RDW: 14.4 % (ref 11.5–15.5)
RDW: 14.5 % (ref 11.5–15.5)
RDW: 14.4 % (ref 11.5–15.5)
WBC: 18.4 10*3/uL — ABNORMAL HIGH (ref 4.0–10.5)
WBC: 18 10*3/uL — ABNORMAL HIGH (ref 4.0–10.5)
WBC: 14 10*3/uL — ABNORMAL HIGH (ref 4.0–10.5)
WBC: 12.3 10*3/uL — AB (ref 4.0–10.5)

## 2018-05-13 LAB — PROTIME-INR
INR: 1.13
PROTHROMBIN TIME: 14.4 s (ref 11.4–15.2)

## 2018-05-13 LAB — BASIC METABOLIC PANEL
Anion gap: 9 (ref 5–15)
Anion gap: 8 (ref 5–15)
BUN: 23 mg/dL — ABNORMAL HIGH (ref 6–20)
BUN: 19 mg/dL (ref 6–20)
CHLORIDE: 117 mmol/L — AB (ref 98–111)
CO2: 12 mmol/L — ABNORMAL LOW (ref 22–32)
CO2: 14 mmol/L — ABNORMAL LOW (ref 22–32)
Calcium: 7.9 mg/dL — ABNORMAL LOW (ref 8.9–10.3)
Calcium: 8 mg/dL — ABNORMAL LOW (ref 8.9–10.3)
Chloride: 118 mmol/L — ABNORMAL HIGH (ref 98–111)
Creatinine, Ser: 1 mg/dL (ref 0.61–1.24)
Creatinine, Ser: 0.96 mg/dL (ref 0.61–1.24)
GFR calc Af Amer: >60 mL/min (ref >60)
GFR calc Af Amer: >60 mL/min (ref >60)
GFR calc non Af Amer: >60 mL/min (ref >60)
GLUCOSE: 141 mg/dL — AB (ref 70–99)
Glucose, Bld: 134 mg/dL — ABNORMAL HIGH (ref 70–99)
POTASSIUM: 3.6 mmol/L (ref 3.5–5.1)
POTASSIUM: 3.5 mmol/L (ref 3.5–5.1)
SODIUM: 139 mmol/L (ref 135–145)
SODIUM: 139 mmol/L (ref 135–145)

## 2018-05-13 LAB — ABO/RH: ABO/RH(D): O POS

## 2018-05-13 LAB — GLUCOSE, CAPILLARY
Glucose-Capillary: 124 mg/dL — ABNORMAL HIGH (ref 70–99)
Glucose-Capillary: 129 mg/dL — ABNORMAL HIGH (ref 70–99)
Glucose-Capillary: 134 mg/dL — ABNORMAL HIGH (ref 70–99)
Glucose-Capillary: 131 mg/dL — ABNORMAL HIGH (ref 70–99)
Glucose-Capillary: 146 mg/dL — ABNORMAL HIGH (ref 70–99)

## 2018-05-13 LAB — PROCALCITONIN: Procalcitonin: <0.1 ng/mL

## 2018-05-13 LAB — HIV ANTIBODY (ROUTINE TESTING W REFLEX): HIV SCREEN 4TH GENERATION: NONREACTIVE

## 2018-05-13 LAB — TYPE AND SCREEN
ABO/RH(D): O POS
ANTIBODY SCREEN: NEGATIVE

## 2018-05-13 LAB — LACTIC ACID, PLASMA
Lactic Acid, Venous: 0.8 mmol/L (ref 0.5–1.9)
Lactic Acid, Venous: 1.1 mmol/L (ref 0.5–1.9)

## 2018-05-13 LAB — APTT: aPTT: 36 seconds (ref 24–36)

## 2018-05-13 SURGERY — CYSTOSCOPY, WITH RETROGRADE PYELOGRAM AND URETERAL STENT INSERTION
Anesthesia: General

## 2018-05-13 MED ORDER — ZOLPIDEM TARTRATE 5 MG PO TABS: 10.0000 mg | ORAL_TABLET | Freq: Every evening | ORAL | Status: DC | PRN

## 2018-05-13 MED ORDER — SENNOSIDES-DOCUSATE SODIUM 8.6-50 MG PO TABS
1.0000 | ORAL_TABLET | Freq: Two times a day (BID) | ORAL | Status: DC
  Administered 2018-05-13 – 2018-05-16 (×5): 1 via ORAL
  Filled 2018-05-13 (×6): qty 1

## 2018-05-13 MED ORDER — POLYETHYLENE GLYCOL 3350 17 G PO PACK
17.0000 g | PACK | Freq: Every day | ORAL | Status: DC
  Administered 2018-05-13 – 2018-05-16 (×3): 17 g via ORAL
  Filled 2018-05-13 (×4): qty 1

## 2018-05-13 MED ORDER — ONDANSETRON HCL 4 MG/2ML IJ SOLN: 4.0000 mg | Freq: Four times a day (QID) | INTRAMUSCULAR | Status: DC | PRN

## 2018-05-13 MED ORDER — ADULT MULTIVITAMIN W/MINERALS CH
1.0000 | ORAL_TABLET | Freq: Every day | ORAL | Status: DC
  Administered 2018-05-13 – 2018-05-16 (×3): 1 via ORAL
  Filled 2018-05-13 (×4): qty 1

## 2018-05-13 MED ORDER — MAGNESIUM OXIDE 400 (241.3 MG) MG PO TABS
400.0000 mg | ORAL_TABLET | Freq: Two times a day (BID) | ORAL | Status: DC
  Administered 2018-05-13 – 2018-05-16 (×6): 400 mg via ORAL
  Filled 2018-05-13 (×8): qty 1

## 2018-05-13 MED ORDER — SODIUM CHLORIDE 0.9 % IV SOLN
INTRAVENOUS | Status: DC | PRN
  Administered 2018-05-13: 02:00:00 via INTRAVENOUS

## 2018-05-13 MED ORDER — MIDAZOLAM HCL 2 MG/2ML IJ SOLN
INTRAMUSCULAR | Status: AC
  Filled 2018-05-13: qty 2

## 2018-05-13 MED ORDER — SODIUM CHLORIDE 0.9 % IV BOLUS: 1000.0000 mL | Freq: Once | INTRAVENOUS | Status: DC

## 2018-05-13 MED ORDER — RISPERIDONE 1 MG PO TABS
1.0000 mg | ORAL_TABLET | Freq: Two times a day (BID) | ORAL | Status: DC
  Administered 2018-05-13 – 2018-05-16 (×6): 1 mg via ORAL
  Filled 2018-05-13 (×8): qty 1

## 2018-05-13 MED ORDER — SUCCINYLCHOLINE CHLORIDE 20 MG/ML IJ SOLN
INTRAMUSCULAR | Status: DC | PRN
  Administered 2018-05-13: 140 mg via INTRAVENOUS

## 2018-05-13 MED ORDER — SUCCINYLCHOLINE CHLORIDE 200 MG/10ML IV SOSY
PREFILLED_SYRINGE | INTRAVENOUS | Status: AC
  Filled 2018-05-13: qty 10

## 2018-05-13 MED ORDER — SODIUM CHLORIDE 0.9 % IV SOLN
INTRAVENOUS | Status: DC
  Administered 2018-05-13 – 2018-05-14 (×7): via INTRAVENOUS

## 2018-05-13 MED ORDER — SODIUM CHLORIDE 0.9 % IV SOLN
1.0000 g | Freq: Once | INTRAVENOUS | Status: AC
  Administered 2018-05-13: 1 g via INTRAVENOUS
  Filled 2018-05-13: qty 10

## 2018-05-13 MED ORDER — MIDAZOLAM HCL 5 MG/5ML IJ SOLN
INTRAMUSCULAR | Status: DC | PRN
  Administered 2018-05-13: 1 mg via INTRAVENOUS

## 2018-05-13 MED ORDER — TAMSULOSIN HCL 0.4 MG PO CAPS
0.4000 mg | ORAL_CAPSULE | Freq: Every day | ORAL | Status: DC
  Administered 2018-05-13 – 2018-05-16 (×3): 0.4 mg via ORAL
  Filled 2018-05-13 (×4): qty 1

## 2018-05-13 MED ORDER — SODIUM CHLORIDE 0.9 % IV BOLUS
1000.0000 mL | Freq: Once | INTRAVENOUS | Status: AC
  Administered 2018-05-13: 1000 mL via INTRAVENOUS

## 2018-05-13 MED ORDER — SODIUM CHLORIDE 0.9 % IV SOLN
1.0000 g | Freq: Once | INTRAVENOUS | Status: AC
  Administered 2018-05-13: 1 g via INTRAVENOUS
  Filled 2018-05-13 (×2): qty 10

## 2018-05-13 MED ORDER — INFLUENZA VAC SPLIT QUAD 0.5 ML IM SUSY
0.5000 mL | PREFILLED_SYRINGE | INTRAMUSCULAR | Status: AC
  Administered 2018-05-15: 0.5 mL via INTRAMUSCULAR
  Filled 2018-05-13: qty 0.5

## 2018-05-13 MED ORDER — LIDOCAINE 2% (20 MG/ML) 5 ML SYRINGE
INTRAMUSCULAR | Status: DC | PRN
  Administered 2018-05-13: 100 mg via INTRAVENOUS

## 2018-05-13 MED ORDER — FENTANYL CITRATE (PF) 100 MCG/2ML IJ SOLN
INTRAMUSCULAR | Status: DC | PRN
  Administered 2018-05-13 (×2): 50 ug via INTRAVENOUS

## 2018-05-13 MED ORDER — FERROUS SULFATE 325 (65 FE) MG PO TABS
325.0000 mg | ORAL_TABLET | Freq: Two times a day (BID) | ORAL | Status: DC
  Administered 2018-05-14 – 2018-05-16 (×3): 325 mg via ORAL
  Filled 2018-05-13 (×5): qty 1

## 2018-05-13 MED ORDER — LORATADINE 10 MG PO TABS
10.0000 mg | ORAL_TABLET | Freq: Every day | ORAL | Status: DC
  Administered 2018-05-13 – 2018-05-16 (×3): 10 mg via ORAL
  Filled 2018-05-13 (×4): qty 1

## 2018-05-13 MED ORDER — ONDANSETRON HCL 4 MG/2ML IJ SOLN
INTRAMUSCULAR | Status: DC | PRN
  Administered 2018-05-13: 4 mg via INTRAVENOUS

## 2018-05-13 MED ORDER — FENTANYL CITRATE (PF) 250 MCG/5ML IJ SOLN
INTRAMUSCULAR | Status: AC
  Filled 2018-05-13: qty 5

## 2018-05-13 MED ORDER — PROPOFOL 10 MG/ML IV BOLUS
INTRAVENOUS | Status: DC | PRN
  Administered 2018-05-13: 150 mg via INTRAVENOUS

## 2018-05-13 MED ORDER — PROPOFOL 10 MG/ML IV BOLUS
INTRAVENOUS | Status: AC
  Filled 2018-05-13: qty 20

## 2018-05-13 MED ORDER — SODIUM CHLORIDE 0.9 % IV SOLN
INTRAVENOUS | Status: DC | PRN
  Administered 2018-05-13: 50 ug/min via INTRAVENOUS

## 2018-05-13 MED ORDER — MORPHINE SULFATE (PF) 4 MG/ML IV SOLN
4.0000 mg | Freq: Once | INTRAVENOUS | Status: AC
  Administered 2018-05-13: 4 mg via INTRAVENOUS
  Filled 2018-05-13: qty 1

## 2018-05-13 MED ORDER — 0.9 % SODIUM CHLORIDE (POUR BTL) OPTIME
TOPICAL | Status: DC | PRN
  Administered 2018-05-13: 1000 mL

## 2018-05-13 MED ORDER — STERILE WATER FOR IRRIGATION IR SOLN
Status: DC | PRN
  Administered 2018-05-13: 1000 mL

## 2018-05-13 MED ORDER — PHENYLEPHRINE 40 MCG/ML (10ML) SYRINGE FOR IV PUSH (FOR BLOOD PRESSURE SUPPORT)
PREFILLED_SYRINGE | INTRAVENOUS | Status: AC
  Filled 2018-05-13: qty 10

## 2018-05-13 MED ORDER — ACETAMINOPHEN 650 MG RE SUPP: 650.0000 mg | Freq: Four times a day (QID) | RECTAL | Status: DC | PRN

## 2018-05-13 MED ORDER — SODIUM CHLORIDE 0.9 % IV BOLUS
500.0000 mL | Freq: Once | INTRAVENOUS | Status: AC
  Administered 2018-05-13: 500 mL via INTRAVENOUS

## 2018-05-13 MED ORDER — PANTOPRAZOLE SODIUM 40 MG IV SOLR
40.0000 mg | Freq: Two times a day (BID) | INTRAVENOUS | Status: DC
  Administered 2018-05-13 – 2018-05-16 (×7): 40 mg via INTRAVENOUS
  Filled 2018-05-13 (×10): qty 40

## 2018-05-13 MED ORDER — IOPAMIDOL (ISOVUE-300) INJECTION 61%
INTRAVENOUS | Status: AC
  Filled 2018-05-13: qty 50

## 2018-05-13 MED ORDER — SODIUM CHLORIDE 0.9 % IV SOLN
2.0000 g | INTRAVENOUS | Status: DC
  Administered 2018-05-14 – 2018-05-16 (×3): 2 g via INTRAVENOUS
  Filled 2018-05-13 (×4): qty 20

## 2018-05-13 MED ORDER — SODIUM CHLORIDE 0.9 % IR SOLN
Status: DC | PRN
  Administered 2018-05-13: 3000 mL

## 2018-05-13 MED ORDER — GLUCERNA SHAKE PO LIQD
237.0000 mL | Freq: Three times a day (TID) | ORAL | Status: DC
  Administered 2018-05-13 – 2018-05-16 (×7): 237 mL via ORAL

## 2018-05-13 MED ORDER — LIDOCAINE HCL URETHRAL/MUCOSAL 2 % EX GEL
CUTANEOUS | Status: AC
  Filled 2018-05-13: qty 20

## 2018-05-13 MED ORDER — LIDOCAINE 2% (20 MG/ML) 5 ML SYRINGE
INTRAMUSCULAR | Status: AC
  Filled 2018-05-13: qty 5

## 2018-05-13 MED ORDER — PHENYLEPHRINE HCL 10 MG/ML IJ SOLN
INTRAMUSCULAR | Status: AC
  Filled 2018-05-13: qty 1

## 2018-05-13 MED ORDER — IOPAMIDOL (ISOVUE-300) INJECTION 61%
INTRAVENOUS | Status: DC | PRN
  Administered 2018-05-13: 20 mL via URETHRAL

## 2018-05-13 MED ORDER — FENTANYL CITRATE (PF) 100 MCG/2ML IJ SOLN: 25.0000 ug | INTRAMUSCULAR | Status: DC | PRN

## 2018-05-13 MED ORDER — OXYCODONE HCL 5 MG PO TABS
5.0000 mg | ORAL_TABLET | Freq: Four times a day (QID) | ORAL | Status: DC | PRN
  Administered 2018-05-13: 5 mg via ORAL
  Filled 2018-05-13: qty 1

## 2018-05-13 MED ORDER — ONDANSETRON HCL 4 MG/2ML IJ SOLN
INTRAMUSCULAR | Status: AC
  Filled 2018-05-13: qty 2

## 2018-05-13 MED ORDER — ONDANSETRON HCL 4 MG PO TABS: 4.0000 mg | ORAL_TABLET | Freq: Four times a day (QID) | ORAL | Status: DC | PRN

## 2018-05-13 MED ORDER — ACETAMINOPHEN 325 MG PO TABS: 650.0000 mg | ORAL_TABLET | Freq: Four times a day (QID) | ORAL | Status: DC | PRN

## 2018-05-13 MED ORDER — MORPHINE SULFATE (PF) 2 MG/ML IV SOLN
2.0000 mg | INTRAVENOUS | Status: DC | PRN
  Administered 2018-05-14: 2 mg via INTRAVENOUS
  Filled 2018-05-13 (×2): qty 1

## 2018-05-13 MED ORDER — MIRTAZAPINE 15 MG PO TABS
15.0000 mg | ORAL_TABLET | Freq: Every day | ORAL | Status: DC
  Administered 2018-05-13 – 2018-05-15 (×3): 15 mg via ORAL
  Filled 2018-05-13 (×4): qty 1

## 2018-05-13 MED ORDER — HYDRALAZINE HCL 20 MG/ML IJ SOLN: 5.0000 mg | INTRAMUSCULAR | Status: DC | PRN

## 2018-05-13 MED ORDER — NICOTINE 21 MG/24HR TD PT24
21.0000 mg | MEDICATED_PATCH | Freq: Every day | TRANSDERMAL | Status: DC | PRN
  Administered 2018-05-13: 21 mg via TRANSDERMAL
  Filled 2018-05-13: qty 1

## 2018-05-13 SURGICAL SUPPLY — 40 items
ADAPTER CATH URET PLST 4-6FR (CATHETERS) IMPLANT
BAG URINE DRAINAGE (UROLOGICAL SUPPLIES) ×2 IMPLANT
BAG URO CATCHER STRL LF (MISCELLANEOUS) ×2 IMPLANT
BENZOIN TINCTURE PRP APPL 2/3 (GAUZE/BANDAGES/DRESSINGS) IMPLANT
BLADE 10 SAFETY STRL DISP (BLADE) ×2 IMPLANT
BLADE CLIPPER SURG (BLADE) IMPLANT
BUCKET BIOHAZARD WASTE 5 GAL (MISCELLANEOUS) ×2 IMPLANT
CATH FOLEY 16FR TEMP PROBE (CATHETERS) ×2 IMPLANT
CATH FOLEY 2WAY SLVR  5CC 16FR (CATHETERS)
CATH FOLEY 2WAY SLVR 5CC 16FR (CATHETERS) IMPLANT
CATH URET 5FR 28IN CONE TIP (BALLOONS)
CATH URET 5FR 28IN OPEN ENDED (CATHETERS) ×4 IMPLANT
CATH URET 5FR 70CM CONE TIP (BALLOONS) IMPLANT
COVER SURGICAL LIGHT HANDLE (MISCELLANEOUS) ×2 IMPLANT
DRAPE CAMERA CLOSED 9X96 (DRAPES) ×4 IMPLANT
GLOVE BIO SURGEON STRL SZ7.5 (GLOVE) ×2 IMPLANT
GLOVE BIOGEL PI IND STRL 6 (GLOVE) ×1 IMPLANT
GLOVE BIOGEL PI INDICATOR 6 (GLOVE) ×1
GLOVE INDICATOR 7.0 STRL GRN (GLOVE) ×2 IMPLANT
GLOVE SURG SS PI 7.0 STRL IVOR (GLOVE) ×2 IMPLANT
GOWN STRL REUS W/ TWL XL LVL3 (GOWN DISPOSABLE) ×2 IMPLANT
GOWN STRL REUS W/TWL XL LVL3 (GOWN DISPOSABLE) ×2
GUIDEWIRE COOK  .035 (WIRE) IMPLANT
GUIDEWIRE STR DUAL SENSOR (WIRE) ×2 IMPLANT
H R LUBE JELLY XXX (MISCELLANEOUS) ×2 IMPLANT
KIT TURNOVER KIT B (KITS) ×2 IMPLANT
NS IRRIG 1000ML POUR BTL (IV SOLUTION) ×2 IMPLANT
PACK CYSTO (CUSTOM PROCEDURE TRAY) ×2 IMPLANT
PAD ARMBOARD 7.5X6 YLW CONV (MISCELLANEOUS) ×4 IMPLANT
PLUG CATH AND CAP STER (CATHETERS) IMPLANT
STENT URET 6FRX24 CONTOUR (STENTS) IMPLANT
STENT URET 6FRX26 CONTOUR (STENTS) ×4 IMPLANT
SYR 20CC LL (SYRINGE) ×2 IMPLANT
SYR CONTROL 10ML LL (SYRINGE) ×2 IMPLANT
SYRINGE TOOMEY DISP (SYRINGE) IMPLANT
TUBE CONNECTING 12X1/4 (SUCTIONS) ×2 IMPLANT
UNDERPAD 30X30 (UNDERPADS AND DIAPERS) ×2 IMPLANT
URETERAL STENT ×4 IMPLANT
WATER STERILE IRR 1000ML POUR (IV SOLUTION) ×2 IMPLANT
WIRE COONS/BENSON .038X145CM (WIRE) IMPLANT

## 2018-05-13 NOTE — Transfer of Care (Signed)
Immediate Anesthesia Transfer of Care Note  Patient: Brian Cisneros  Procedure(s) Performed: CYSTOSCOPY WITH RETROGRADE PYELOGRAM/URETERAL STENT PLACEMENT (N/A )  Patient Location: PACU  Anesthesia Type:General  Level of Consciousness: awake and alert   Airway & Oxygen Therapy: Patient Spontanous Breathing and Patient connected to nasal cannula oxygen  Post-op Assessment: Report given to RN and Post -op Vital signs reviewed and stable  Post vital signs: Reviewed and stable  Last Vitals:  Vitals Value Taken Time  BP 143/83 05/13/2018  2:25 AM  Temp    Pulse 103 05/13/2018  2:26 AM  Resp 17 05/13/2018  2:26 AM  SpO2 95 % 05/13/2018  2:26 AM  Vitals shown include unvalidated device data.  Last Pain:  Vitals:   05/13/18 0048  TempSrc:   PainSc: Asleep         Complications: No apparent anesthesia complications

## 2018-05-13 NOTE — Progress Notes (Signed)
PROGRESS NOTE    Brian Cisneros  KXF:[id number] DOB: November 19, 1960 DOA: 05/12/2018 PCP: [facility]    Brief Narrative:  Brian Cisneros is a 57 y.o. male with medical history significant of hypertension, former smoker (quit month ago), GERD, depression, who presents with nausea, vomiting, lower abdominal pain and hematemesis.  Patient states that he has been having nausea, vomiting and lower abdominal pain for more than 4 days, which has worsened today.  He states that he vomited at least 5 times, sometimes with dark bloody materials.  His abdominal pain is located in the lower abdomen, constant, sharp, 8 out of 10 severity, nonradiating.  He states that he has a burning on urination, but denies dysuria or increased urinary frequency.  He has subjective fever and chills.  His temperature is normal 97.6 in ED.  Patient does not have chest pain, shortness breath, cough.  No unilateral weakness.  Per his mother, patient was treated with antibiotics for UTI 3 weeks ago, but they did not know the name of antibiotics.  ED Course: pt was found to have WBC 23.5, AKI with creatinine 1.79, lipase 31, urinalysis (negative for leukocytes, rare bacteria, clear appearance, WBC 11-20), temperature normal, tachycardia, tachypnea, oxygen saturation 97% on room air, temperature 97.6.  CT abdomen/pelvis showed: 1. Left obstructive uropathy with 7 x 8 x 10 mm stone in the proximal left ureter causing mild hydroureter and pelviectasis. 2. Multiple bilateral nonobstructive renal calculi measuring up to 5 mm on the right and 9 mm on the left. 3. Enlarged prostate and markedly distended urinary bladder. 4. Chronic compression deformities of T12, L1 and L2.    Assessment & Plan:   Principal Problem:   Sepsis due to urinary tract infection ([place]) Active Problems:   Acute pyelonephritis   Ureteral stone with hydronephrosis   Tobacco abuse   Essential hypertension   GERD (gastroesophageal reflux  disease)   Depression   Hematemesis   AKI (acute kidney injury) ([place])   Sepsis ([place])   Dehydration  #1 sepsis secondary to pyelonephritis/UTI/Infected stone secondary to obstructive urethral stone with hydronephrosis Patient on admission met criteria for sepsis with leukocytosis, tachycardia, tachypnea, urinalysis with pyuria with a WBC of 11-20, patient also noted to be acidotic.  Patient pancultured.  Patient seen in consultation by urology and underwent cystoscopy with retrograde pyelogram and urethral stent placement 05/13/2018.  Patient with a leukocytosis.  Continue empiric IV Rocephin.  Urology following.  2.  Dehydration Give a liter bolus of normal saline.  Continue IV fluids.  Follow.  3.  Hypertension Continue to hold antihypertensive medications as patient at increased risk for developing hypotension secondary to sepsis.  IV hydralazine as needed.  4.  Tobacco abuse Nicotine patch.  Noted that patient with tobacco cessation proximally a month ago.  5.  Hematemesis/nausea/vomiting/lower abdominal pain Likely secondary to problem #1.  Hematemesis may have been due to Mallory-Weiss tear.  No ongoing hematemesis.  Patient with a prior history of peptic ulcer disease.  Hemoglobin was elevated on admission secondary to dehydration.  Hemoglobin trending down however likely dilutional in nature.  Continue PPI IV twice daily.  Antiemetics.  IV fluids.  Follow.  Transfusion threshold hemoglobin less than 7.  6.  Gastroesophageal reflux disease Continue PPI.  7.  Acute renal failure Likely secondary to obstructive uropathy and a prerenal azotemia in the setting of UTI/acute pyelonephritis.  Urine cultures pending.  Patient status post cystoscopy with retrograde pyelogram and urethral stent placement 05/13/2018.  Renal function trending  down with hydration.  Hold nephrotoxins.  Continue IV fluids.  Follow.  8.  Acidosis Likely secondary to acute infection/sepsis and acute renal failure.   Continue IV fluids.  Patient has been pancultured.  Continue empiric IV antibiotics.  Repeat labs this afternoon and if still significantly acidotic may need to place on a bicarb drip.  Follow.   DVT prophylaxis: Postop per urology./SCDs. Code Status: Full Family Communication: Updated patient.  No family at bedside. Disposition Plan: Likely home when clinically improved, resolution of sepsis, transition to oral antibiotics and when okay with urology.   Consultants:  Urology: Dr. Tresa Moore 05/13/2018  Procedures:   CT abdomen and pelvis 05/12/2018  Cystoscopy with retrograde pyelogram and ureteral stent placement per interventional radiology, Dr. Pascal Lux 05/13/2018 and Dr Tresa Moore urology 05/13/2018.  Chest x-ray 05/22/2018  Antimicrobials:   IV Rocephin 05/13/2018   Subjective: Patient drowsy however easily arousable and following commands and answering questions.  Patient denies any chest pain.  Patient denies any further nausea or vomiting or hematemesis.  Patient states some improvement with abdominal pain.  Objective: Vitals:   05/13/18 0245 05/13/18 0300 05/13/18 0333 05/13/18 0340  BP: 128/80 (!) 153/83 122/84   Pulse: 100 93 93   Resp: (!) 24 19 20    Temp:  99 F (37.2 C) 98.3 F (36.8 C)   TempSrc:   Oral   SpO2: 96% 98% 100%   Weight:    87.3 kg    Intake/Output Summary (Last 24 hours) at 05/13/2018 0952 Last data filed at 05/13/2018 0600 Gross per 24 hour  Intake 5632.17 ml  Output 355 ml  Net 5277.17 ml   Filed Weights   05/13/18 0340  Weight: 87.3 kg    Examination:  General exam: Drowsy.  Dry mucous membranes. Respiratory system: Clear to auscultation. Respiratory effort normal. Cardiovascular system: S1 & S2 heard, RRR. No JVD, murmurs, rubs, gallops or clicks. No pedal edema. Gastrointestinal system: Abdomen is nondistended, soft and nontender. No organomegaly or masses felt. Normal bowel sounds heard. Central nervous system: Drowsy. No focal neurological  deficits. Extremities: Symmetric 5 x 5 power. Skin: No rashes, lesions or ulcers Psychiatry: Judgement and insight appear normal. Mood & affect appropriate.     Data Reviewed: I have personally reviewed following labs and imaging studies  CBC: Recent Labs  Lab 05/12/18 1959 05/13/18 0254 05/13/18 0630  WBC 23.5* 18.4* 18.0*  HGB 17.9* 15.2 14.1  HCT 55.3* 46.4 43.5  MCV 87.1 86.4 86.8  PLT 432* 355 784   Basic Metabolic Panel: Recent Labs  Lab 05/12/18 1959 05/13/18 0630  NA 137 139  K 4.5 3.6  CL 109 118*  CO2 9* 12*  GLUCOSE 193* 134*  BUN 25* 23*  CREATININE 1.79* 1.00  CALCIUM 9.2 7.9*   GFR: CrCl cannot be calculated (Unknown ideal weight.). Liver Function Tests: Recent Labs  Lab 05/12/18 1959  AST 12*  ALT 17  ALKPHOS 92  BILITOT 1.4*  PROT 7.4  ALBUMIN 4.4   Recent Labs  Lab 05/12/18 1959  LIPASE 31   No results for input(s): AMMONIA in the last 168 hours. Coagulation Profile: Recent Labs  Lab 05/13/18 0048  INR 1.13   Cardiac Enzymes: No results for input(s): CKTOTAL, CKMB, CKMBINDEX, TROPONINI in the last 168 hours. BNP (last 3 results) No results for input(s): PROBNP in the last 8760 hours. HbA1C: No results for input(s): HGBA1C in the last 72 hours. CBG: Recent Labs  Lab 05/13/18 0338 05/13/18 0743  GLUCAP 124*  129*   Lipid Profile: No results for input(s): CHOL, HDL, LDLCALC, TRIG, CHOLHDL, LDLDIRECT in the last 72 hours. Thyroid Function Tests: No results for input(s): TSH, T4TOTAL, FREET4, T3FREE, THYROIDAB in the last 72 hours. Anemia Panel: No results for input(s): VITAMINB12, FOLATE, FERRITIN, TIBC, IRON, RETICCTPCT in the last 72 hours. Sepsis Labs: Recent Labs  Lab 05/13/18 0048 05/13/18 0254  PROCALCITON <0.10  --   LATICACIDVEN 0.8 1.1    No results found for this or any previous visit (from the past 240 hour(s)).       Radiology Studies: Ct Abdomen Pelvis W Contrast  Result Date: 05/12/2018 CLINICAL  DATA:  Abdominal pain and neutropenia EXAM: CT ABDOMEN AND PELVIS WITH CONTRAST TECHNIQUE: Multidetector CT imaging of the abdomen and pelvis was performed using the standard protocol following bolus administration of intravenous contrast. CONTRAST:  154m ISOVUE-300 IOPAMIDOL (ISOVUE-300) INJECTION 61% COMPARISON:  CT abdomen pelvis 03/29/2014 FINDINGS: LOWER CHEST: There is no basilar pleural or apical pericardial effusion. HEPATOBILIARY: The hepatic contours and density are normal. There is no intra- or extrahepatic biliary dilatation. Status post cholecystectomy. PANCREAS: The pancreatic parenchymal contours are normal and there is no ductal dilatation. There is no peripancreatic fluid collection. SPLEEN: Normal. ADRENALS/URINARY TRACT: --Adrenal glands: Normal. --Right kidney/ureter: There are multiple nonobstructing renal calculi, measuring up to 5 mm. No hydronephrosis. --Left kidney/ureter: There is an obstructing stone within the proximal left ureter that measures 7 x 8 x 10 mm. There is mild proximal hydroureter with minimal perinephric edema. There are multiple other left renal calculi that measure up to 9 mm. --Urinary bladder: Markedly distended. STOMACH/BOWEL: --Stomach/Duodenum: There is no hiatal hernia or other gastric abnormality. The duodenal course and caliber are normal. --Small bowel: No dilatation or inflammation. --Colon: No focal abnormality. --Appendix: Normal. VASCULAR/LYMPHATIC: Normal course and caliber of the major abdominal vessels. No abdominal or pelvic lymphadenopathy. REPRODUCTIVE: Prostate is enlarged measuring 5.2 cm in transverse dimension, with calcifications. MUSCULOSKELETAL. Compression deformities of T12, L1 and L2 are unchanged. OTHER: None. IMPRESSION: 1. Left obstructive uropathy with 7 x 8 x 10 mm stone in the proximal left ureter causing mild hydroureter and pelviectasis. 2. Multiple bilateral nonobstructive renal calculi measuring up to 5 mm on the right and 9 mm on  the left. 3. Enlarged prostate and markedly distended urinary bladder. 4. Chronic compression deformities of T12, L1 and L2. Electronically Signed   By: KUlyses JarredM.D.   On: 05/12/2018 23:18   Dg Retrograde Pyelogram  Result Date: 05/13/2018 CLINICAL DATA:  Cystoscopy with retrograde pyelogram and ureteral stent placement. EXAM: RETROGRADE PYELOGRAM COMPARISON:  CT abdomen and pelvis-05/12/2018 FLUOROSCOPY TIME:  49 seconds FINDINGS: 8 spot intraoperative fluoroscopic images during bilateral retrograde pyelogram and ureteral stent placement are provided for review Initial image demonstrates selective cannulation of the left ureter. There is a persistent near occlusive filling defect within the proximal aspect the left ureter, likely compatible with the partially obstructing ureteral stone demonstrated on preceding abdominal CT. Note is again made of mild upstream left-sided pelvicaliectasis. Subsequent image demonstrates placement of a left-sided double-J ureteral stent with superior coil overlying the left renal pelvis and inferior coil overlying the urinary bladder. Subsequent images demonstrate selective cannulation opacification of the right ureter. There are no discrete filling defects within the opacified portions of the right renal collecting system. No definite evidence of pelvicaliectasis given contrast injection. Subsequent images demonstrate placement of a double-J ureteral stent with superior coil overlying the right renal pelvis and inferior coil overlying the urinary  bladder. IMPRESSION: Bilateral retrograde pyelogram and ureteral stent placement as above. Electronically Signed   By: Sandi Mariscal M.D.   On: 05/13/2018 07:43   Dg Chest Portable 1 View  Result Date: 05/12/2018 CLINICAL DATA:  Hematemesis EXAM: PORTABLE CHEST 1 VIEW COMPARISON:  Portable exam 2027 hours compared to 03/29/2014 FINDINGS: Normal heart size, mediastinal contours, and pulmonary vascularity. Lungs clear. No pulmonary  infiltrate, pleural effusion or pneumothorax. No free air under the diaphragms. Old healed LEFT rib fractures. Bones demineralized. IMPRESSION: No acute abnormalities. Electronically Signed   By: Lavonia Dana M.D.   On: 05/12/2018 21:05        Scheduled Meds: . [START ON 05/14/2018] ferrous sulfate  325 mg Oral BID  . [START ON 05/14/2018] Influenza vac split quadrivalent PF  0.5 mL Intramuscular Tomorrow-1000  . loratadine  10 mg Oral Daily  . magnesium oxide  400 mg Oral BID  . mirtazapine  15 mg Oral QHS  . pantoprazole  40 mg Intravenous Q12H  . risperiDONE  1 mg Oral BID  . tamsulosin  0.4 mg Oral Daily   Continuous Infusions: . sodium chloride 125 mL/hr at 05/13/18 0849  . [START ON 05/14/2018] cefTRIAXone (ROCEPHIN)  IV       LOS: 0 days    Time spent: 40 minutes    Irine Seal, MD [facility] Pager [phone]  If 7PM-7AM, please contact night-coverage [email or web address] Password [email or web address] 05/13/2018, 9:52 AM

## 2018-05-13 NOTE — H&P (Signed)
History and Physical    Brian Cisneros PVV:[id number] DOB: 02/07/1961 DOA: 05/12/2018  Referring MD/NP/PA:   PCP: [facility]   Patient coming from:  The patient is coming from home.  At baseline, pt is independent for most of ADL.    Chief Complaint: Nausea, vomiting, lower abdominal pain, hematemesis  HPI: Brian Cisneros is a 57 y.o. male with medical history significant of hypertension, former smoker (quit month ago), GERD, depression, who presents with nausea, vomiting, lower abdominal pain and hematemesis.  Patient states that he has been having nausea, vomiting and lower abdominal pain for more than 4 days, which has worsened today.  He states that he vomited at least 5 times, sometimes with dark bloody materials.  His abdominal pain is located in the lower abdomen, constant, sharp, 8 out of 10 severity, nonradiating.  He states that he has a burning on urination, but denies dysuria or increased urinary frequency.  He has subjective fever and chills.  His temperature is normal 97.6 in ED.  Patient does not have chest pain, shortness breath, cough.  No unilateral weakness.  Per his mother, patient was treated with antibiotics for UTI 3 weeks ago, but they did not know the name of antibiotics.  ED Course: pt was found to have WBC 23.5, AKI with creatinine 1.79, lipase 31, urinalysis (negative for leukocytes, rare bacteria, clear appearance, WBC 11-20), temperature normal, tachycardia, tachypnea, oxygen saturation 97% on room air, temperature 97.6.  CT abdomen/pelvis showed: 1. Left obstructive uropathy with 7 x 8 x 10 mm stone in the proximal left ureter causing mild hydroureter and pelviectasis. 2. Multiple bilateral nonobstructive renal calculi measuring up to 5 mm on the right and 9 mm on the left. 3. Enlarged prostate and markedly distended urinary bladder. 4. Chronic compression deformities of T12, L1 and L2.  Review of Systems:   General: Has subjective fevers,  chills, no body weight gain, has poor appetite, has fatigue HEENT: no blurry vision, hearing changes or sore throat Respiratory: no dyspnea, coughing, wheezing CV: no chest pain, no palpitations GI: Has nausea, vomiting, abdominal pain, hematemesis, no diarrhea, constipation GU: no dysuria, has burning on urination, no increased urinary frequency Ext: no leg edema Neuro: no unilateral weakness, numbness, or tingling, no vision change or hearing loss Skin: no rash, no skin tear. MSK: No muscle spasm, no deformity, no limitation of range of movement in spin Heme: No easy bruising.  Travel history: No recent long distant travel.  Allergy: No Known Allergies  Past Medical History:  Diagnosis Date  . Depression    PTSD per mother  . Hypertension     Past Surgical History:  Procedure Laterality Date  . CYSTOSCOPY/RETROGRADE/URETEROSCOPY/STONE EXTRACTION WITH BASKET Right 03/29/2014   Procedure: CYSTOSCOPY/RETROGRADE/URETEROSCOPY/STONE EXTRACTION WITH BASKET, STENT PLACEMENT;  Surgeon: Kathi Ludwig, MD;  Location: [facility];  Service: Urology;  Laterality: Right;    Social History:  reports that he has quit smoking. His smoking use included cigarettes. He has never used smokeless tobacco. He reports that he drinks alcohol. He reports that he does not use drugs.  Family History:  Family History  Problem Relation Age of Onset  . Diabetes Mellitus II Mother   . Diabetes Mellitus II Sister   . Arthritis/Rheumatoid Sister      Prior to Admission medications   Medication Sig Start Date End Date Taking? Authorizing Provider  cetirizine (ZYRTEC) 10 MG tablet Take 10 mg by mouth at bedtime.   Yes [provider]  ferrous  sulfate 325 (65 FE) MG EC tablet Take 325 mg by mouth 2 (two) times daily.   Yes [provider]  gabapentin (NEURONTIN) 300 MG capsule Take 300 mg by mouth 4 (four) times daily.   Yes [provider]  Magnesium Oxide 420 MG TABS Take 420 mg  by mouth 2 (two) times daily.   Yes [provider]  mirtazapine (REMERON) 15 MG tablet Take 15 mg by mouth at bedtime.   Yes [provider]  omeprazole (PRILOSEC) 20 MG capsule Take 40 mg by mouth 2 (two) times daily before a meal.   Yes [provider]  potassium chloride SA (K-DUR,KLOR-CON) 20 MEQ tablet Take 20 mEq by mouth 2 (two) times daily.   Yes [provider]  prazosin (MINIPRESS) 1 MG capsule Take 1 mg by mouth at bedtime.   Yes [provider]  risperiDONE (RISPERDAL) 2 MG tablet Take 1 mg by mouth 2 (two) times daily.   Yes [provider]  tamsulosin (FLOMAX) 0.4 MG CAPS capsule Take 1 capsule (0.4 mg total) by mouth daily. 03/29/14  Yes Ria Comment, MD  zolpidem (AMBIEN) 10 MG tablet Take 10 mg by mouth at bedtime as needed for sleep.   Yes [provider]  cephALEXin (KEFLEX) 500 MG capsule Take 1 capsule (500 mg total) by mouth 4 (four) times daily. 04/17/14   Junious Silk, PA-C  HYDROcodone-acetaminophen (NORCO/VICODIN) 5-325 MG per tablet Take 1-2 tablets by mouth every 6 (six) hours as needed for moderate pain. 03/29/14   Ria Comment, MD  lisinopril (PRINIVIL,ZESTRIL) 20 MG tablet Take 0.5 tablets (10 mg total) by mouth daily. 09/15/13   Nonie Hoyer, PA-C  methocarbamol (ROBAXIN) 500 MG tablet Take 2 tablets (1,000 mg total) by mouth every 8 (eight) hours as needed for muscle spasms. 09/15/13   Nonie Hoyer, PA-C  oxyCODONE-acetaminophen (PERCOCET/ROXICET) 5-325 MG per tablet Take 1-2 tablets by mouth every 4 (four) hours as needed for moderate pain or severe pain. 04/01/14   Emilia Beck, PA-C    Physical Exam: Vitals:   05/13/18 0245 05/13/18 0300 05/13/18 0333 05/13/18 0340  BP: 128/80 (!) 153/83 122/84   Pulse: 100 93 93   Resp: (!) 24 19 20    Temp:  99 F (37.2 C) 98.3 F (36.8 C)   TempSrc:   Oral   SpO2: 96% 98% 100%   Weight:    87.3 kg   General: Not in acute distress HEENT:        Eyes: PERRL, EOMI, no scleral icterus.       ENT: No discharge from the ears and nose, no pharynx injection, no tonsillar enlargement.        Neck: No JVD, no bruit, no mass felt. Heme: No neck lymph node enlargement. Cardiac: S1/S2, RRR, No murmurs, No gallops or rubs. Respiratory: No rales, wheezing, rhonchi or rubs. GI: Soft, nondistended, has mild tenderness in suprapubic area, no rebound pain, no organomegaly, BS present. GU: No hematuria Ext: No pitting leg edema bilaterally. 2+DP/PT pulse bilaterally. Musculoskeletal: No joint deformities, No joint redness or warmth, no limitation of ROM in spin. Skin: No rashes.  Neuro: Alert, oriented X3, cranial nerves II-XII grossly intact, moves all extremities normally.  Psych: Patient is not psychotic, no suicidal or hemocidal ideation.  Labs on Admission: I have personally reviewed following labs and imaging studies  CBC: Recent Labs  Lab 05/12/18 1959 05/13/18 0254  WBC 23.5* 18.4*  HGB 17.9* 15.2  HCT 55.3* 46.4  MCV 87.1 86.4  PLT 432* 355   Basic Metabolic Panel: Recent Labs  Lab 05/12/18 1959  NA 137  K 4.5  CL 109  CO2 9*  GLUCOSE 193*  BUN 25*  CREATININE 1.79*  CALCIUM 9.2   GFR: CrCl cannot be calculated (Unknown ideal weight.). Liver Function Tests: Recent Labs  Lab 05/12/18 1959  AST 12*  ALT 17  ALKPHOS 92  BILITOT 1.4*  PROT 7.4  ALBUMIN 4.4   Recent Labs  Lab 05/12/18 1959  LIPASE 31   No results for input(s): AMMONIA in the last 168 hours. Coagulation Profile: Recent Labs  Lab 05/13/18 0048  INR 1.13   Cardiac Enzymes: No results for input(s): CKTOTAL, CKMB, CKMBINDEX, TROPONINI in the last 168 hours. BNP (last 3 results) No results for input(s): PROBNP in the last 8760 hours. HbA1C: No results for input(s): HGBA1C in the last 72 hours. CBG: Recent Labs  Lab 05/13/18 0338  GLUCAP 124*   Lipid Profile: No results for input(s): CHOL, HDL, LDLCALC, TRIG, CHOLHDL, LDLDIRECT in  the last 72 hours. Thyroid Function Tests: No results for input(s): TSH, T4TOTAL, FREET4, T3FREE, THYROIDAB in the last 72 hours. Anemia Panel: No results for input(s): VITAMINB12, FOLATE, FERRITIN, TIBC, IRON, RETICCTPCT in the last 72 hours. Urine analysis:    Component Value Date/Time   COLORURINE YELLOW 05/12/2018 2058   APPEARANCEUR CLEAR 05/12/2018 2058   LABSPEC 1.017 05/12/2018 2058   PHURINE 6.0 05/12/2018 2058   GLUCOSEU NEGATIVE 05/12/2018 2058   HGBUR SMALL (A) 05/12/2018 2058   BILIRUBINUR NEGATIVE 05/12/2018 2058   KETONESUR 80 (A) 05/12/2018 2058   PROTEINUR 100 (A) 05/12/2018 2058   UROBILINOGEN 0.2 04/17/2014 1924   NITRITE NEGATIVE 05/12/2018 2058   LEUKOCYTESUR NEGATIVE 05/12/2018 2058   Sepsis Labs: @LABRCNTIP (procalcitonin:4,lacticidven:4) )No results found for this or any previous visit (from the past 240 hour(s)).   Radiological Exams on Admission: Ct Abdomen Pelvis W Contrast  Result Date: 05/12/2018 CLINICAL DATA:  Abdominal pain and neutropenia EXAM: CT ABDOMEN AND PELVIS WITH CONTRAST TECHNIQUE: Multidetector CT imaging of the abdomen and pelvis was performed using the standard protocol following bolus administration of intravenous contrast. CONTRAST:  ISOVUE-300 IOPAMIDOL (ISOVUE-300) INJECTION 61% COMPARISON:  CT abdomen pelvis 03/29/2014 FINDINGS: LOWER CHEST: There is no basilar pleural or apical pericardial effusion. HEPATOBILIARY: The hepatic contours and density are normal. There is no intra- or extrahepatic biliary dilatation. Status post cholecystectomy. PANCREAS: The pancreatic parenchymal contours are normal and there is no ductal dilatation. There is no peripancreatic fluid collection. SPLEEN: Normal. ADRENALS/URINARY TRACT: --Adrenal glands: Normal. --Right kidney/ureter: There are multiple nonobstructing renal calculi, measuring up to 5 mm. No hydronephrosis. --Left kidney/ureter: There is an obstructing stone within the proximal left ureter  that measures 7 x 8 x 10 mm. There is mild proximal hydroureter with minimal perinephric edema. There are multiple other left renal calculi that measure up to 9 mm. --Urinary bladder: Markedly distended. STOMACH/BOWEL: --Stomach/Duodenum: There is no hiatal hernia or other gastric abnormality. The duodenal course and caliber are normal. --Small bowel: No dilatation or inflammation. --Colon: No focal abnormality. --Appendix: Normal. VASCULAR/LYMPHATIC: Normal course and caliber of the major abdominal vessels. No abdominal or pelvic lymphadenopathy. REPRODUCTIVE: Prostate is enlarged measuring 5.2 cm in transverse dimension, with calcifications. MUSCULOSKELETAL. Compression deformities of T12, L1 and L2 are unchanged. OTHER: None. IMPRESSION: 1. Left obstructive uropathy with 7 x 8 x 10 mm stone in the proximal left ureter causing mild hydroureter and pelviectasis. 2. Multiple bilateral nonobstructive  renal calculi measuring up to 5 mm on the right and 9 mm on the left. 3. Enlarged prostate and markedly distended urinary bladder. 4. Chronic compression deformities of T12, L1 and L2. Electronically Signed   By: Deatra Robinson M.D.   On: 05/12/2018 23:18   Dg Chest Portable 1 View  Result Date: 05/12/2018 CLINICAL DATA:  Hematemesis EXAM: PORTABLE CHEST 1 VIEW COMPARISON:  Portable exam 2027 hours compared to 03/29/2014 FINDINGS: Normal heart size, mediastinal contours, and pulmonary vascularity. Lungs clear. No pulmonary infiltrate, pleural effusion or pneumothorax. No free air under the diaphragms. Old healed LEFT rib fractures. Bones demineralized. IMPRESSION: No acute abnormalities. Electronically Signed   By: Ulyses Southward M.D.   On: 05/12/2018 21:05     EKG: Independently reviewed.  Sinus rhythm, QTC 479, PVC, nonspecific T wave change.  Assessment/Plan Principal Problem:   Ureteral stone with hydronephrosis Active Problems:   Tobacco abuse   Essential hypertension   GERD (gastroesophageal reflux  disease)   Depression   Hematemesis   AKI (acute kidney injury) (HCC)   Sepsis (HCC)   Sepsis due to urinary tract infection (HCC)   Acute pyelonephritis   Obstructive ureteral stone with hydronephrosis and and pelviectasis: Urology, Dr. Berneice Heinrich was consulted, will take patient to the OR tonight. Pt has pyuria and is septic, but hemodynamically stable currently.  -will admitted to telemetry bed as inpatient -started Rocephin IV -will get Procalcitonin and trend lactic acid levels per sepsis protocol. -IVF: 2.5 of NS bolus in ED, followed by 125 cc/h  -f/u Bx and Ux -continue flomax  Sepsis due to pyelonephritis secondary to obstructive ureteral stone: Patient meets criteria for sepsis with leukocytosis, tachycardia and tachypnea.  Currently hemodynamically stable.  Urinalysis showed pyuria with WBC 11-20. -On antibiotics and IV fluid as above  Hematemesis, nausea, vomiting, lower abdominal pain: His nausea, vomiting, lower abdominal pain are most likely due to pyelonephritis and obstructive ureteral stone.  Differential diagnosis for hematemesis includes Mallory-Weiss syndrome and peptic ulcer disease.  Per his mother, patient had EGD several years ago, which showed ulcer.  Hemoglobin 17.9.  Hemodynamically stable. - Start IV pantoprazole 40 mg bid - Zofran IV for nausea - Avoid NSAIDs and SQ heparin - Maintain IV access (2 large bore IVs if possible). - Monitor closely and follow q6h cbc, transfuse as necessary, if Hgb<7.0  GERD: -Protonix IV  HTN: -hold lisinopril, prazosin since patient is at risk of developing hypotension secondary to sepsis. -IV hydralazine as needed  Tobacco abuse: quit 1 month ago -prn nicotine patch  Depression: -Mirtazapine  AKI (acute kidney injury) (HCC): likely due to pyelonephritis and hydronephrosis.  Lisinopril use may have also contributed to partially -IV fluid as above - follow-up renal function by BMP -hold lisinopril   Inpatient  status:  # Patient requires inpatient status due to high intensity of service, high risk for further deterioration and high frequency of surveillance required.  I certify that at the point of admission it is my clinical judgment that the patient will require inpatient hospital care spanning beyond 2 midnights from the point of admission.  . This patient has multiple chronic comorbidities including hypertension, former smoker (quit month ago), GERD, depression . Now patient has presenting symptoms include nausea, vomiting, lower abdominal pain and hematemesis. . The worrisome physical exam findings include lower abdominal pain . The initial radiographic and laboratory data are worrisome because of sepsis with leukocytosis, tachycardia and tachypnea, AKI, pyelonephritis with positive urinalysis, obstructive ureteral stone with hydronephrosis.  Pt will need surgery by urology. . Current medical needs: please see my assessment and plan        DVT ppx: SCD Code Status: Full code Family Communication: Yes, patient's  mother at bed side Disposition Plan:  Anticipate discharge back to previous home environment Consults called: Urology, Dr. Berneice Heinrich Admission status:   Inpatient/tele     Date of Service 05/13/2018    Lorretta Harp [facility] Pager [phone]  If 7PM-7AM, please contact night-coverage [email or web address] Password [email or web address] 05/13/2018, 4:17 AM

## 2018-05-13 NOTE — Anesthesia Procedure Notes (Signed)
Procedure Name: Intubation Date/Time: 05/13/2018 1:54 AM Performed by: Edmonia Caprio, CRNA Pre-anesthesia Checklist: Patient identified, Emergency Drugs available, Suction available, Patient being monitored and Timeout performed Patient Re-evaluated:Patient Re-evaluated prior to induction Oxygen Delivery Method: Circle system utilized Preoxygenation: Pre-oxygenation with 100% oxygen Induction Type: IV induction, Rapid sequence and Cricoid Pressure applied Laryngoscope Size: Miller and 2 Grade View: Grade I Tube type: Oral Tube size: 7.5 mm Number of attempts: 1 Airway Equipment and Method: Stylet Placement Confirmation: positive ETCO2,  ETT inserted through vocal cords under direct vision and breath sounds checked- equal and bilateral Secured at: 23 cm Tube secured with: Tape Dental Injury: Teeth and Oropharynx as per pre-operative assessment

## 2018-05-13 NOTE — Plan of Care (Signed)
  Problem: Elimination: Goal: Will not experience complications related to bowel motility Outcome: Progressing Pt. Has foley catheter   Problem: Pain Managment: Goal: General experience of comfort will improve Outcome: Progressing  Pt. States pain medication helps with his pain Problem: Safety: Goal: Ability to remain free from injury will improve Outcome: Progressing  Pt. Knows to use call light to call for assistance

## 2018-05-13 NOTE — Progress Notes (Signed)
Initial Nutrition Assessment  DOCUMENTATION CODES:   Not applicable  INTERVENTION:   -Glucerna Shake po TID, each supplement provides 220 kcal and 10 grams of protein -MVI with minerals daily  NUTRITION DIAGNOSIS:   Inadequate oral intake related to decreased appetite as evidenced by meal completion < 50%.  GOAL:   Patient will meet greater than or equal to 90% of their needs  MONITOR:   PO intake, Supplement acceptance, Labs, Weight trends, Skin, I & O's  REASON FOR ASSESSMENT:   Malnutrition Screening Tool    ASSESSMENT:   Brian Cisneros is a 57 y.o. male with medical history significant of hypertension, former smoker (quit month ago), GERD, depression, who presents with nausea, vomiting, lower abdominal pain and hematemesis.  Pt admitted with obstructed uretal stone with hydronephrosis.   9/8- s/p cystoscopy with retrograde pyelogram/uretal stent placement  Pt sleeping soundly at time of visit and did not arouse to voice or touch. No family present to provide further history.   Reviewed wt hx; noted per CareEverywhere wt on 04/16/18 was 90.7 kg. Pt has experienced a 3.7% wt loss over the past month, which is not significant for time frame.   Pt with poor oral intake; noted meal completion 25-50%. Observed breakfast tray at bedside, which was untouched. Pt would benefit from addition of nutritional supplements.   Labs reviewed: CBGS: 124-129.   NUTRITION - FOCUSED PHYSICAL EXAM:    Most Recent Value  Orbital Region  No depletion  Upper Arm Region  No depletion  Thoracic and Lumbar Region  No depletion  Buccal Region  No depletion  Temple Region  No depletion  Clavicle Bone Region  No depletion  Clavicle and Acromion Bone Region  No depletion  Scapular Bone Region  No depletion  Dorsal Hand  No depletion  Patellar Region  No depletion  Anterior Thigh Region  No depletion  Posterior Calf Region  No depletion  Edema (RD Assessment)  None  Hair  Reviewed   Eyes  Reviewed  Mouth  Reviewed  Skin  Reviewed  Nails  Reviewed       Diet Order:   Diet Order            Diet Carb Modified Fluid consistency: Thin; Room service appropriate? Yes  Diet effective now              EDUCATION NEEDS:   Not appropriate for education at this time  Skin:  Skin Assessment: Reviewed RN Assessment  Last BM:  05/10/18  Height:   Ht Readings from Last 1 Encounters:  05/13/18 5\' 8"  (1.727 m)    Weight:   Wt Readings from Last 1 Encounters:  05/13/18 87.3 kg    Ideal Body Weight:  70 kg  BMI:  Body mass index is 29.26 kg/m.  Estimated Nutritional Needs:   Kcal:  1900-2100  Protein:  90-105 grams  Fluid:  1.9-2.1 L    Roth Ress A. Mayford Knife, RD, LDN, CDE Pager: [phone] After hours Pager: [phone]

## 2018-05-13 NOTE — Anesthesia Preprocedure Evaluation (Addendum)
Anesthesia Evaluation  Patient identified by MRN, date of birth, ID band Patient awake    Reviewed: Allergy & Precautions, NPO status , Patient's Chart, lab work & pertinent test results  Airway Mallampati: II  TM Distance: >3 FB     Dental  (+) Edentulous Upper, Dental Advisory Given   Pulmonary former smoker,    breath sounds clear to auscultation       Cardiovascular hypertension,  Rhythm:Regular Rate:Normal     Neuro/Psych    GI/Hepatic GERD  ,  Endo/Other  negative endocrine ROS  Renal/GU Renal disease     Musculoskeletal   Abdominal   Peds  Hematology negative hematology ROS (+)   Anesthesia Other Findings   Reproductive/Obstetrics                            Anesthesia Physical Anesthesia Plan  ASA: III  Anesthesia Plan: General   Post-op Pain Management:    Induction: Intravenous  PONV Risk Score and Plan: Treatment may vary due to age or medical condition  Airway Management Planned: Oral ETT  Additional Equipment:   Intra-op Plan:   Post-operative Plan: Extubation in OR  Informed Consent: I have reviewed the patients History and Physical, chart, labs and discussed the procedure including the risks, benefits and alternatives for the proposed anesthesia with the patient or authorized representative who has indicated his/her understanding and acceptance.   Dental advisory given  Plan Discussed with: CRNA and Anesthesiologist  Anesthesia Plan Comments:         Anesthesia Quick Evaluation

## 2018-05-13 NOTE — Op Note (Signed)
NAMEJAWORSKI, Brian Cisneros MEDICAL RECORD UU:[id number] ACCOUNT [id number] DATE OF BIRTH:01/15/1961 FACILITY: [facility] LOCATION: [id number] PHYSICIAN:Eriberto Felch, MD  OPERATIVE REPORT  DATE OF PROCEDURE:  05/12/2018  PREOPERATIVE DIAGNOSIS:  Left ureteral, bilateral renal stones, urosepsis.  PROCEDURE: 1.  Cystoscopy, bilateral pyelograms, interpretation. 2.  Insertion of bilateral ureteral stents 6 x 26 Contour, no tether.  ESTIMATED BLOOD LOSS:  Nil.  COMPLICATIONS:  None.  SPECIMENS:  None.  FINDINGS: 1.  Mild left hydronephrosis with a filling defect in the midureter consistent with known stone. 2.  Successful placement of bilateral ureteral stents proximal and renal pelvis, distally in urinary bladder. 3.  The efflux of proteinaceous and purulent appearing urine from the distal end of the left stent following placement.  INDICATIONS:  The patient is a 57 year old gentleman with history of psychiatric disease, diabetes, recurrent nephrolithiasis who was found on workup of a prodrome of colicky left flank pain and malaise to have a left mid ureteral stone.  Also of  significance leukocytosis, lactic acidosis concerning for impending urosepsis.  Options discussed for management including recommended path of urgent renal decompression with bilateral stents.  He wished to proceed.  Informed consent was obtained and  placed in medical record.  DESCRIPTION OF PROCEDURE:  The patient was identified.  The procedure being cystoscopy, bilateral stent placement confirmed.  Procedure timeout was performed.  Intravenous antibiotics administered.  General anesthesia induced.  The patient was placed  into a low lithotomy position, sterile field was created prepping and draping his penis, perineum and proximal thighs using iodine.  Cystourethroscopy was performed with a 20-French rigid cystoscope offset lens.  Inspection of anterior and posterior  urethra were unremarkable.  Inspection of bladder  revealed no diverticula, calcifications or papillary lesions.  The left ureteral orifice was cannulated with a 5-French end-hole catheter and left retrograde pyelogram was obtained.  Left radial pyelogram demonstrated a single left ureter single system left kidney.  There is multifocal filling defect in the midureter consistent with known stone.  There was mild hydroureteronephrosis above this.  A 0.038 sensor wire was advanced to  lower pole of which a new 6 x 26 Contour type stent was placed using cystoscopic and fluoroscopic guidance.  Good proximal and distal plane were noted.  There was efflux of proteinaceous and purulent appearing urine around the distal end of the stent.   Attention was directed at right side.  The right ureteral orifice was cannulated with a 5 French end-hole catheter and right retrograde pyelogram was obtained.  Right retrograde pyelogram demonstrated a single right ureter single system right kidney.  No obvious filling defects or narrowing noted.  A 0.038 sensor wire was advanced to lower pole of which a new 6 x 26 Contour type stent was placed using  cystoscopic and fluoroscopic guidance.  Good proximal and distal points were noted.  A temperature probe Foley was placed per urethra straight drain 10 mL sterile water in the balloon and the procedure terminated.  The patient tolerated the procedure  well.  No immediate complications.  The patient was taken to postanesthesia care in stable condition.  TN/NUANCE  D:05/13/2018 T:05/13/2018 JOB:[id number]

## 2018-05-13 NOTE — Consult Note (Signed)
Reason for Consult: Left ureteral, Bilateral Renal Stoens, Urosepsis  Referring Physician: Irven Baltimore MD  Brian Cisneros is an 57 y.o. male.   HPI:    1 - Left ureteral, Bilateral Renal Stones - Left 14m mid ureteral stone with moderate hydro + bilateral scattered renal stones (about 1cm volume each kidney) by ER CT on eval left flank pain and malaise. Long h/o recurrent stones, [facility]   2 - Urosepsis - malaise, chills, leukocytosis to 23k, lactic acidosis and pyuria c/w early urosepsis in setting of obstructing stone. Started on empiric rocephin 05/13/18. [place] [place] pending.   PMH sig for GERD, PTSD, DM2. [facility]   Today "Brian Cisneros is seen for emergent consultation for septic stones. Last meal 7.5 hours ago.   Past Medical History:  Diagnosis Date  . Depression    PTSD per mother  . Hypertension     Past Surgical History:  Procedure Laterality Date  . CYSTOSCOPY/RETROGRADE/URETEROSCOPY/STONE EXTRACTION WITH BASKET Right 03/29/2014   Procedure: CYSTOSCOPY/RETROGRADE/URETEROSCOPY/STONE EXTRACTION WITH BASKET, STENT PLACEMENT;  Surgeon: SAilene Rud MD;  Location: [facility];  Service: Urology;  Laterality: Right;    History reviewed. No pertinent family history.  Social History:  reports that he has quit smoking. His smoking use included cigarettes. He has never used smokeless tobacco. He reports that he drinks alcohol. He reports that he does not use drugs.  Allergies: No Known Allergies  Medications: I have reviewed the patient's current medications.  Results for orders placed or performed during the hospital encounter of 05/12/18 (from the past 48 hour(s))  Lipase, blood     Status: None   Collection Time: 05/12/18  7:59 PM  Result Value Ref Range   Lipase 31 11 - 51 U/L    Comment: Performed at [facility] [address], [place] [address] Comprehensive metabolic panel     Status:  Abnormal   Collection Time: 05/12/18  7:59 PM  Result Value Ref Range   Sodium 137 135 - 145 mmol/L   Potassium 4.5 3.5 - 5.1 mmol/L   Chloride 109 98 - 111 mmol/L   CO2 9 (L) 22 - 32 mmol/L   Glucose, Bld 193 (H) 70 - 99 mg/dL   BUN 25 (H) 6 - 20 mg/dL   Creatinine, Ser 1.79 (H) 0.61 - 1.24 mg/dL   Calcium 9.2 8.9 - 10.3 mg/dL   Total Protein 7.4 6.5 - 8.1 g/dL   Albumin 4.4 3.5 - 5.0 g/dL   AST 12 (L) 15 - 41 U/L   ALT 17 0 - 44 U/L   Alkaline Phosphatase 92 38 - 126 U/L   Total Bilirubin 1.4 (H) 0.3 - 1.2 mg/dL   GFR calc non Af Amer 41 (L) >60 mL/min   GFR calc Af Amer 47 (L) >60 mL/min    Comment: (NOTE) The eGFR has been calculated using the CKD EPI equation. This calculation has not been validated in all clinical situations. eGFR's persistently <60 mL/min signify possible Chronic Kidney Disease.    Anion gap 19 (H) 5 - 15    Comment: Performed at [facility] [id number] [id number] CBC     Status: Abnormal   Collection Time: 05/12/18  7:59 PM  Result Value Ref Range   WBC 23.5 (H) 4.0 - 10.5 K/uL   RBC 6.35 (H) 4.22 - 5.81 MIL/uL   Hemoglobin 17.9 (H) 13.0 -  17.0 g/dL   HCT 55.3 (H) 39.0 - 52.0 %   MCV 87.1 78.0 - 100.0 fL   MCH 28.2 26.0 - 34.0 pg   MCHC 32.4 30.0 - 36.0 g/dL   RDW 14.2 11.5 - 15.5 %   Platelets 432 (H) 150 - 400 K/uL    Comment: Performed at [facility] [address], [place], [place] [address]  Urinalysis, Routine w reflex microscopic     Status: Abnormal   Collection Time: 05/12/18  8:58 PM  Result Value Ref Range   Color, Urine YELLOW YELLOW   APPearance CLEAR CLEAR   Specific Gravity, Urine 1.017 1.005 - 1.030   pH 6.0 5.0 - 8.0   Glucose, UA NEGATIVE NEGATIVE mg/dL   Hgb urine dipstick SMALL (A) NEGATIVE   Bilirubin Urine NEGATIVE NEGATIVE   Ketones, ur 80 (A) NEGATIVE mg/dL   Protein, ur 100 (A) NEGATIVE mg/dL   Nitrite NEGATIVE NEGATIVE   Leukocytes, UA NEGATIVE NEGATIVE   RBC / HPF 11-20 0 - 5  RBC/hpf   WBC, UA 11-20 0 - 5 WBC/hpf   Bacteria, UA RARE (A) NONE SEEN   Mucus PRESENT    Hyaline Casts, UA PRESENT     Comment: Performed at [facility] [address], [place], [place] [address]    Ct Abdomen Pelvis W Contrast  Result Date: 05/12/2018 CLINICAL DATA:  Abdominal pain and neutropenia EXAM: CT ABDOMEN AND PELVIS WITH CONTRAST TECHNIQUE: Multidetector CT imaging of the abdomen and pelvis was performed using the standard protocol following bolus administration of intravenous contrast. CONTRAST:  119m ISOVUE-300 IOPAMIDOL (ISOVUE-300) INJECTION 61% COMPARISON:  CT abdomen pelvis 03/29/2014 FINDINGS: LOWER CHEST: There is no basilar pleural or apical pericardial effusion. HEPATOBILIARY: The hepatic contours and density are normal. There is no intra- or extrahepatic biliary dilatation. Status post cholecystectomy. PANCREAS: The pancreatic parenchymal contours are normal and there is no ductal dilatation. There is no peripancreatic fluid collection. SPLEEN: Normal. ADRENALS/URINARY TRACT: --Adrenal glands: Normal. --Right kidney/ureter: There are multiple nonobstructing renal calculi, measuring up to 5 mm. No hydronephrosis. --Left kidney/ureter: There is an obstructing stone within the proximal left ureter that measures 7 x 8 x 10 mm. There is mild proximal hydroureter with minimal perinephric edema. There are multiple other left renal calculi that measure up to 9 mm. --Urinary bladder: Markedly distended. STOMACH/BOWEL: --Stomach/Duodenum: There is no hiatal hernia or other gastric abnormality. The duodenal course and caliber are normal. --Small bowel: No dilatation or inflammation. --Colon: No focal abnormality. --Appendix: Normal. VASCULAR/LYMPHATIC: Normal course and caliber of the major abdominal vessels. No abdominal or pelvic lymphadenopathy. REPRODUCTIVE: Prostate is enlarged measuring 5.2 cm in transverse dimension, with calcifications. MUSCULOSKELETAL. Compression deformities  of T12, L1 and L2 are unchanged. OTHER: None. IMPRESSION: 1. Left obstructive uropathy with 7 x 8 x 10 mm stone in the proximal left ureter causing mild hydroureter and pelviectasis. 2. Multiple bilateral nonobstructive renal calculi measuring up to 5 mm on the right and 9 mm on the left. 3. Enlarged prostate and markedly distended urinary bladder. 4. Chronic compression deformities of T12, L1 and L2. Electronically Signed   By: KUlyses JarredM.D.   On: 05/12/2018 23:18   Dg Chest Portable 1 View  Result Date: 05/12/2018 CLINICAL DATA:  Hematemesis EXAM: PORTABLE CHEST 1 VIEW COMPARISON:  Portable exam 2027 hours compared to 03/29/2014 FINDINGS: Normal heart size, mediastinal contours, and pulmonary vascularity. Lungs clear. No pulmonary infiltrate, pleural effusion or pneumothorax. No free air under  the diaphragms. Old healed LEFT rib fractures. Bones demineralized. IMPRESSION: No acute abnormalities. Electronically Signed   By: Lavonia Dana M.D.   On: 05/12/2018 21:05    Review of Systems  Constitutional: Positive for malaise/fatigue.  HENT: Negative.   Eyes: Negative.   Respiratory: Negative.   Cardiovascular: Negative.   Gastrointestinal: Positive for nausea.  Genitourinary: Positive for flank pain.  Skin: Negative.   Neurological: Negative.   Endo/Heme/Allergies: Negative.    Blood pressure (!) 163/90, pulse 100, temperature 97.6 F (36.4 C), temperature source Oral, resp. rate (!) 22, SpO2 97 %. Physical Exam  Constitutional: He is oriented to person, place, and time. He appears well-developed.  Appears older than stated age. Family at bedside.   Eyes: Pupils are equal, round, and reactive to light.  Neck: Normal range of motion.  Cardiovascular:  Regular tachycardia  Respiratory: Effort normal.  GI: Soft.  Genitourinary:  Genitourinary Comments: Mild left CVAT at present.   Neurological: He is alert and oriented to person, place, and time.  Skin: Skin is warm.  Psychiatric: He  has a normal mood and affect.    Assessment/Plan:    1 - Left ureteral, Bilateral Renal Stones   - discussed rec of urgent renal decompression. Rec bilateral stents (as he does have potential for bilateral obstruction), alternatives such as neph tubes discussed. Then rec bilateral ureteroscopy to stone free in elective setting pending pt's ultimate goals. He desires to proceed. Risks, benefits, peri-op course discssed.   2 - Urosepsis - renal decompression as per above, agree with rocephin pendin further CX data. Rec continue in house IV ABX until prelim CS data back and no high grade fevers x 24 hours.  Greatly appreciate hospitalist team comanagment.   Daquisha Clermont 05/13/2018, 12:52 AM

## 2018-05-13 NOTE — Brief Op Note (Signed)
05/12/2018 - 05/13/2018  2:10 AM  PATIENT:  Brian Cisneros  57 y.o. male  PRE-OPERATIVE DIAGNOSIS:  Bilateral Kidney stones  POST-OPERATIVE DIAGNOSIS:  * No post-op diagnosis entered *  PROCEDURE:  Procedure(s): CYSTOSCOPY WITH RETROGRADE PYELOGRAM/URETERAL STENT PLACEMENT (N/A)  SURGEON:  Surgeon(s) and Role:    * Sebastian Ache, MD - Primary  PHYSICIAN ASSISTANT:   ASSISTANTS: none   ANESTHESIA:   general  EBL:  minimal   BLOOD ADMINISTERED:none  DRAINS: temp probe foley to gravity    LOCAL MEDICATIONS USED:  NONE  SPECIMEN:  No Specimen  DISPOSITION OF SPECIMEN:  N/A  COUNTS:  YES  TOURNIQUET:  * No tourniquets in log *  DICTATION: .Other Dictation: Dictation Number [id number]  PLAN OF CARE: Admit to inpatient   PATIENT DISPOSITION:  PACU - hemodynamically stable.   Delay start of Pharmacological VTE agent (>24hrs) due to surgical blood loss or risk of bleeding: yes

## 2018-05-13 NOTE — ED Notes (Signed)
Patient transported to OR -[id number].[id number],

## 2018-05-13 NOTE — Anesthesia Postprocedure Evaluation (Signed)
Anesthesia Post Note  Patient: Brian Cisneros  Procedure(s) Performed: CYSTOSCOPY WITH RETROGRADE PYELOGRAM/URETERAL STENT PLACEMENT (N/A )     Patient location during evaluation: PACU Anesthesia Type: General Level of consciousness: awake Pain management: pain level controlled Vital Signs Assessment: post-procedure vital signs reviewed and stable Respiratory status: spontaneous breathing Cardiovascular status: stable Postop Assessment: no apparent nausea or vomiting Anesthetic complications: no    Last Vitals:  Vitals:   05/13/18 0300 05/13/18 0333  BP: (!) 153/83 122/84  Pulse: 93 93  Resp: 19 20  Temp: 37.2 C 36.8 C  SpO2: 98% 100%    Last Pain:  Vitals:   05/13/18 0333  TempSrc: Oral  PainSc:                  Moriah Shawley

## 2018-05-14 ENCOUNTER — Ambulatory Visit (HOSPITAL_COMMUNITY): Payer: No Typology Code available for payment source

## 2018-05-14 ENCOUNTER — Inpatient Hospital Stay (HOSPITAL_COMMUNITY): Payer: No Typology Code available for payment source

## 2018-05-14 DIAGNOSIS — F329 Major depressive disorder, single episode, unspecified: Secondary | ICD-10-CM

## 2018-05-14 DIAGNOSIS — R1310 Dysphagia, unspecified: Secondary | ICD-10-CM

## 2018-05-14 DIAGNOSIS — R079 Chest pain, unspecified: Secondary | ICD-10-CM

## 2018-05-14 LAB — CBC WITH DIFFERENTIAL/PLATELET
ABS IMMATURE GRANULOCYTES: 0 10*3/uL (ref 0.0–0.1)
Basophils Absolute: 0 10*3/uL (ref 0.0–0.1)
Basophils Relative: 0 %
EOS ABS: 0.1 10*3/uL (ref 0.0–0.7)
Eosinophils Relative: 1 %
HEMATOCRIT: 38.9 % — AB (ref 39.0–52.0)
Hemoglobin: 12.6 g/dL — ABNORMAL LOW (ref 13.0–17.0)
IMMATURE GRANULOCYTES: 0 %
LYMPHS ABS: 2 10*3/uL (ref 0.7–4.0)
Lymphocytes Relative: 19 %
MCH: 27.4 pg (ref 26.0–34.0)
MCHC: 32.4 g/dL (ref 30.0–36.0)
MCV: 84.6 fL (ref 78.0–100.0)
MONOS PCT: 9 %
Monocytes Absolute: 1 10*3/uL (ref 0.1–1.0)
NEUTROS ABS: 7.6 10*3/uL (ref 1.7–7.7)
NEUTROS PCT: 71 %
Platelets: 222 10*3/uL (ref 150–400)
RBC: 4.6 MIL/uL (ref 4.22–5.81)
RDW: 14.3 % (ref 11.5–15.5)
WBC: 10.7 10*3/uL — AB (ref 4.0–10.5)

## 2018-05-14 LAB — COMPREHENSIVE METABOLIC PANEL
ALT: 12 U/L (ref 0–44)
ANION GAP: 7 (ref 5–15)
AST: 9 U/L — ABNORMAL LOW (ref 15–41)
Albumin: 3 g/dL — ABNORMAL LOW (ref 3.5–5.0)
Alkaline Phosphatase: 59 U/L (ref 38–126)
BUN: 11 mg/dL (ref 6–20)
CHLORIDE: 120 mmol/L — AB (ref 98–111)
CO2: 15 mmol/L — AB (ref 22–32)
CREATININE: 0.61 mg/dL (ref 0.61–1.24)
Calcium: 8.5 mg/dL — ABNORMAL LOW (ref 8.9–10.3)
Glucose, Bld: 120 mg/dL — ABNORMAL HIGH (ref 70–99)
POTASSIUM: 2.9 mmol/L — AB (ref 3.5–5.1)
SODIUM: 142 mmol/L (ref 135–145)
Total Bilirubin: 0.7 mg/dL (ref 0.3–1.2)
Total Protein: 5.3 g/dL — ABNORMAL LOW (ref 6.5–8.1)

## 2018-05-14 LAB — GLUCOSE, CAPILLARY
GLUCOSE-CAPILLARY: 128 mg/dL — AB (ref 70–99)
GLUCOSE-CAPILLARY: 142 mg/dL — AB (ref 70–99)
GLUCOSE-CAPILLARY: 139 mg/dL — AB (ref 70–99)
Glucose-Capillary: 222 mg/dL — ABNORMAL HIGH (ref 70–99)

## 2018-05-14 LAB — TROPONIN I
Troponin I: <0.03 ng/mL (ref <0.03)
Troponin I: <0.03 ng/mL (ref <0.03)

## 2018-05-14 LAB — MAGNESIUM: Magnesium: 2.3 mg/dL (ref 1.7–2.4)

## 2018-05-14 MED ORDER — SODIUM BICARBONATE 8.4 % IV SOLN
INTRAVENOUS | Status: DC
  Administered 2018-05-14 – 2018-05-15 (×3): via INTRAVENOUS
  Filled 2018-05-14 (×7): qty 1000

## 2018-05-14 MED ORDER — NITROGLYCERIN 0.4 MG SL SUBL
SUBLINGUAL_TABLET | SUBLINGUAL | Status: AC
  Filled 2018-05-14: qty 1

## 2018-05-14 MED ORDER — NITROGLYCERIN 0.4 MG SL SUBL
0.4000 mg | SUBLINGUAL_TABLET | SUBLINGUAL | Status: DC | PRN
  Administered 2018-05-14: 0.4 mg via SUBLINGUAL

## 2018-05-14 MED ORDER — GI COCKTAIL ~~LOC~~
30.0000 mL | Freq: Three times a day (TID) | ORAL | Status: DC | PRN
  Administered 2018-05-14: 30 mL via ORAL
  Filled 2018-05-14: qty 30

## 2018-05-14 MED ORDER — POTASSIUM CHLORIDE CRYS ER 20 MEQ PO TBCR
40.0000 meq | EXTENDED_RELEASE_TABLET | ORAL | Status: AC
  Administered 2018-05-14 (×2): 40 meq via ORAL
  Filled 2018-05-14 (×2): qty 2

## 2018-05-14 NOTE — Progress Notes (Signed)
MD reviewed EKG  EKG placed in chart

## 2018-05-14 NOTE — Progress Notes (Signed)
1 Day Post-Op   Subjective/Chief Complaint:   1 - Left ureteral, Bilateral Renal Stones - s/p urgent bilateral ureteral stenting 05/13/18 for Left 9mm mid ureteral stone with moderate hydro + bilateral scattered renal stones (about 1cm volume each kidney) by ER CT on eval left flank pain and malaise. Long h/o recurrent stones, follows Dr. Terie Purser at [facility].   2 - Urosepsis - malaise, chills, leukocytosis to 23k, lactic acidosis and pyuria c/w early urosepsis in setting of obstructing stone. Started on empiric rocephin 05/13/18. BCX, UCX 9/9 pending.    Today "Brian Cisneros" is  Improving. WBC now 10.7, Cr 0.6, and remains afebrile.   Objective: Vital signs in last 24 hours: Temp:  [97.9 F (36.6 C)-98.2 F (36.8 C)] 98.2 F (36.8 C) (09/10 1724) Pulse Rate:  [75-97] 97 (09/10 1724) Resp:  [16-28] 28 (09/10 1724) BP: (129-172)/(75-97) 129/75 (09/10 1724) SpO2:  [97 %-100 %] 97 % (09/10 1724) Weight:  [87.4 kg] 87.4 kg (09/10 0519) Last BM Date: 05/11/18  Intake/Output from previous day: 09/09 0701 - 09/10 0700 In: 3137.8 [P.O.:977; I.V.:2060.8; IV Piggyback:100] Out: 4600 [Urine:4600] Intake/Output this shift: Total I/O In: 900 [I.V.:900] Out: 800 [Urine:800]  Physical Exam  Constitutional: He is oriented to person, place, and time. He appears well-developed.  Appears older than stated age.  Eyes: Pupils are equal, round, and reactive to light.  Neck: Normal range of motion.  Cardiovascular:  Nl rate.  Respiratory: Effort normal.  GI: Soft.  Genitourinary:  Genitourinary Comments: Foley in place with grossly clear urine that is non-foul. Removed.  Neurological: He is alert and oriented to person, place, and time.  Skin: Skin is warm.   Lab Results:  Recent Labs    05/13/18 1835 05/14/18 0801  WBC 12.3* 10.7*  HGB 12.6* 12.6*  HCT 38.7* 38.9*  PLT 243 222   BMET Recent Labs    05/13/18 1240 05/14/18 0801  NA 139 142  K 3.5 2.9*  CL 117* 120*   CO2 14* 15*  GLUCOSE 141* 120*  BUN 19 11  CREATININE 0.96 0.61  CALCIUM 8.0* 8.5*   PT/INR Recent Labs    05/13/18 0048  LABPROT 14.4  INR 1.13   ABG No results for input(s): PHART, HCO3 in the last 72 hours.  Invalid input(s): PCO2, PO2  Studies/Results: Ct Abdomen Pelvis W Contrast  Result Date: 05/12/2018 CLINICAL DATA:  Abdominal pain and neutropenia EXAM: CT ABDOMEN AND PELVIS WITH CONTRAST TECHNIQUE: Multidetector CT imaging of the abdomen and pelvis was performed using the standard protocol following bolus administration of intravenous contrast. CONTRAST:  ISOVUE-300 IOPAMIDOL (ISOVUE-300) INJECTION 61% COMPARISON:  CT abdomen pelvis 03/29/2014 FINDINGS: LOWER CHEST: There is no basilar pleural or apical pericardial effusion. HEPATOBILIARY: The hepatic contours and density are normal. There is no intra- or extrahepatic biliary dilatation. Status post cholecystectomy. PANCREAS: The pancreatic parenchymal contours are normal and there is no ductal dilatation. There is no peripancreatic fluid collection. SPLEEN: Normal. ADRENALS/URINARY TRACT: --Adrenal glands: Normal. --Right kidney/ureter: There are multiple nonobstructing renal calculi, measuring up to 5 mm. No hydronephrosis. --Left kidney/ureter: There is an obstructing stone within the proximal left ureter that measures 7 x 8 x 10 mm. There is mild proximal hydroureter with minimal perinephric edema. There are multiple other left renal calculi that measure up to 9 mm. --Urinary bladder: Markedly distended. STOMACH/BOWEL: --Stomach/Duodenum: There is no hiatal hernia or other gastric abnormality. The duodenal course and caliber are normal. --Small bowel: No dilatation or inflammation. --Colon:  No focal abnormality. --Appendix: Normal. VASCULAR/LYMPHATIC: Normal course and caliber of the major abdominal vessels. No abdominal or pelvic lymphadenopathy. REPRODUCTIVE: Prostate is enlarged measuring 5.2 cm in transverse dimension,  with calcifications. MUSCULOSKELETAL. Compression deformities of T12, L1 and L2 are unchanged. OTHER: None. IMPRESSION: 1. Left obstructive uropathy with 7 x 8 x 10 mm stone in the proximal left ureter causing mild hydroureter and pelviectasis. 2. Multiple bilateral nonobstructive renal calculi measuring up to 5 mm on the right and 9 mm on the left. 3. Enlarged prostate and markedly distended urinary bladder. 4. Chronic compression deformities of T12, L1 and L2. Electronically Signed   By: Deatra Robinson M.D.   On: 05/12/2018 23:18   Dg Esophagus  Result Date: 05/14/2018 CLINICAL DATA:  Dysphagia.  Pill got stuck in throat. EXAM: ESOPHOGRAM/BARIUM SWALLOW TECHNIQUE: Single contrast examination was performed using  thin barium. FLUOROSCOPY TIME:  Fluoroscopy Time:  1 minutes and 54 seconds. Radiation Exposure Index (if provided by the fluoroscopic device): 106.3 mGy Number of Acquired Spot Images: 0 COMPARISON:  None. FINDINGS: Study limited by patient immobility. Patient was placed 30-45 degree head up on the fluoro table with LPO positioning relative to the bed position. Thin barium was administered from a cup and the esophagus was monitored fluoroscopically while the patient with swallowing. Disruption of primary peristalsis noted on all swallows. Distal third of the esophagus is essentially without a primary peristaltic wave. The anterior mucosa of the distal third of the esophagus is irregular and was difficult to fully distend due to patient position. 13 mm barium tablet passes to the level of the distal third of the esophagus but did not pass into the stomach with repeated swallows of water and thin barium. 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 Review of the recent abdomen and pelvis CT of 05/12/2018 suggests esophageal wall thickening in this same region and esophagitis would be a consideration. Upper endoscopy may prove helpful to  further evaluate. Dysphagia.  Please select appropriate template. Electronically Signed   By: Kennith Center M.D.   On: 05/14/2018 16:25   Dg Retrograde Pyelogram  Result Date: 05/13/2018 CLINICAL DATA:  Cystoscopy with retrograde pyelogram and ureteral stent placement. EXAM: RETROGRADE PYELOGRAM COMPARISON:  CT abdomen and pelvis-05/12/2018 FLUOROSCOPY TIME:  49 seconds FINDINGS: 8 spot intraoperative fluoroscopic images during bilateral retrograde pyelogram and ureteral stent placement are provided for review Initial image demonstrates selective cannulation of the left ureter. There is a persistent near occlusive filling defect within the proximal aspect the left ureter, likely compatible with the partially obstructing ureteral stone demonstrated on preceding abdominal CT. Note is again made of mild upstream left-sided pelvicaliectasis. Subsequent image demonstrates placement of a left-sided double-J ureteral stent with superior coil overlying the left renal pelvis and inferior coil overlying the urinary bladder. Subsequent images demonstrate selective cannulation opacification of the right ureter. There are no discrete filling defects within the opacified portions of the right renal collecting system. No definite evidence of pelvicaliectasis given contrast injection. Subsequent images demonstrate placement of a double-J ureteral stent with superior coil overlying the right renal pelvis and inferior coil overlying the urinary bladder. IMPRESSION: Bilateral retrograde pyelogram and ureteral stent placement as above. Electronically Signed   By: Simonne Come M.D.   On: 05/13/2018 07:43   Dg Chest Portable 1 View  Result Date: 05/12/2018 CLINICAL DATA:  Hematemesis EXAM: PORTABLE CHEST 1 VIEW COMPARISON:  Portable exam 2027 hours compared to  03/29/2014 FINDINGS: Normal heart size, mediastinal contours, and pulmonary vascularity. Lungs clear. No pulmonary infiltrate, pleural effusion or pneumothorax. No free air  under the diaphragms. Old healed LEFT rib fractures. Bones demineralized. IMPRESSION: No acute abnormalities. Electronically Signed   By: Ulyses Southward M.D.   On: 05/12/2018 21:05    Anti-infectives: Anti-infectives (From admission, onward)   Start     Dose/Rate Route Frequency Ordered Stop   05/14/18 0000  cefTRIAXone (ROCEPHIN) 2 g in sodium chloride 0.9 % 100 mL IVPB     2 g 200 mL/hr over 30 Minutes Intravenous Every 24 hours 05/13/18 0047     05/13/18 0100  cefTRIAXone (ROCEPHIN) 1 g in sodium chloride 0.9 % 100 mL IVPB     1 g 200 mL/hr over 30 Minutes Intravenous  Once 05/13/18 0052 05/13/18 0632   05/13/18 0015  cefTRIAXone (ROCEPHIN) 1 g in sodium chloride 0.9 % 100 mL IVPB     1 g 200 mL/hr over 30 Minutes Intravenous  Once 05/13/18 0004 05/13/18 0045      Assessment/Plan:  1 - Left ureteral, Bilateral Renal Stones   - temporized with bilatearl stents. WIll need definitive stone management wtith bilateral ureteroscopy in few weeks in [place] or [place].  2 - Urosepsis - renal decompression as per above, agree with rocephin pendin further CX data. He is clearing his infections parameters quickly.   Foley out today.   Greatly appreciate hospitalist team comanagment.   [facility], Kelci Petrella 05/14/2018

## 2018-05-14 NOTE — Progress Notes (Addendum)
Was called per RN patient with c/o CP.  When and assessed patient and patient with complaints of midsternal chest pain sharp in nature which is improved from a 10 out of a 10 to a 2 out of 10 after nitroglycerin and IV morphine.  Patient had complaints early on of dysphasia.  Patient being managed for sepsis on empiric IV antibiotics.  Patient status post stent placement.  EKG with normal sinus rhythm and nonspecific T wave changes.  Cycle cardiac enzymes every 6 hours x3.  Check a 2D echo.  Barium esophagram pending.  GI cocktail as needed.  Monitor.

## 2018-05-14 NOTE — Progress Notes (Signed)
After GI cocktail pain in reported 2 at this time.

## 2018-05-14 NOTE — Progress Notes (Signed)
Dr. Janee Morn in room assessing patient.   Pt denies chest pain at this time.

## 2018-05-14 NOTE — Progress Notes (Signed)
Patient denied taking po medications tonight due to pain and difficulty swallowing earlier today. MD was paged and gave orders to hold po meds for tonight.   Elsie Lincoln, RN

## 2018-05-14 NOTE — Progress Notes (Signed)
Pt complains chest pain 5/10 states it is not really chest pain when he swallows it hurts.  Gave Gi cocktail.

## 2018-05-14 NOTE — Progress Notes (Signed)
Dr. Janee Morn paged and made aware of active chest pain. Adult chest pain protocol initiated. Nitro 0.4 and 2mg  morphine give for pain 10/10. Patient pain now at 0. EKG done. New orders given.

## 2018-05-14 NOTE — Progress Notes (Addendum)
PROGRESS NOTE    Brian Cisneros  QGB:[id number] DOB: [birth year] DOA: 05/12/2018 PCP: [facility]    Brief Narrative:  Brian Cisneros is a 57 y.o. male with medical history significant of hypertension, former smoker (quit month ago), GERD, depression, who presents with nausea, vomiting, lower abdominal pain and hematemesis.  Patient states that he has been having nausea, vomiting and lower abdominal pain for more than 4 days, which has worsened today.  He states that he vomited at least 5 times, sometimes with dark bloody materials.  His abdominal pain is located in the lower abdomen, constant, sharp, 8 out of 10 severity, nonradiating.  He states that he has a burning on urination, but denies dysuria or increased urinary frequency.  He has subjective fever and chills.  His temperature is normal 97.6 in ED.  Patient does not have chest pain, shortness breath, cough.  No unilateral weakness.  Per his mother, patient was treated with antibiotics for UTI 3 weeks ago, but they did not know the name of antibiotics.  ED Course: pt was found to have WBC 23.5, AKI with creatinine 1.79, lipase 31, urinalysis (negative for leukocytes, rare bacteria, clear appearance, WBC 11-20), temperature normal, tachycardia, tachypnea, oxygen saturation 97% on room air, temperature 97.6.  CT abdomen/pelvis showed: 1. Left obstructive uropathy with 7 x 8 x 10 mm stone in the proximal left ureter causing mild hydroureter and pelviectasis. 2. Multiple bilateral nonobstructive renal calculi measuring up to 5 mm on the right and 9 mm on the left. 3. Enlarged prostate and markedly distended urinary bladder. 4. Chronic compression deformities of T12, L1 and L2.    Assessment & Plan:   Principal Problem:   Sepsis due to urinary tract infection ([place]) Active Problems:   Acute pyelonephritis   Ureteral stone with hydronephrosis   Tobacco abuse   Essential hypertension   GERD (gastroesophageal reflux  disease)   Depression   Hematemesis   AKI (acute kidney injury) ([place])   Sepsis ([place])   Dehydration   Dysphagia  #1 sepsis secondary to pyelonephritis/UTI/Infected stone secondary to obstructive urethral stone with hydronephrosis Patient on admission met criteria for sepsis with leukocytosis, tachycardia, tachypnea, urinalysis with pyuria with a WBC of 11-20, patient also noted to be acidotic.  Patient pancultured.  Patient seen in consultation by urology and underwent cystoscopy with retrograde pyelogram and urethral stent placement 05/13/2018.  Patient with a leukocytosis is trending down.  Blood cultures pending.  Urine cultures pending.  Continue empiric IV Rocephin.  Urology following.  2.  Dehydration Patient on IV fluids.  Will place patient on bicarb drip.  Continue hydration.  Follow.    3.  Hypertension Continue to hold antihypertensive medications as patient at increased risk for developing hypotension secondary to sepsis.  BP currently stable.  Monitor with hydration.  IV hydralazine as needed.  4.  Tobacco abuse Nicotine patch.  Noted that patient with tobacco cessation a month ago.  5.  Hematemesis/nausea/vomiting/lower abdominal pain Likely secondary to problem #1.  Hematemesis may have been due to Mallory-Weiss tear.  No ongoing hematemesis.  Patient with a prior history of peptic ulcer disease.  Hemoglobin was elevated on admission secondary to dehydration.  Hemoglobin trending down however likely dilutional in nature seems to have stabilized at 12.6..  Continue PPI IV twice daily.  Antiemetics.  IV fluids.  Follow.  Transfusion threshold hemoglobin less than 7.  6.  Gastroesophageal reflux disease Continue PPI.  7.  Acute renal failure Likely secondary to obstructive  uropathy and a prerenal azotemia in the setting of UTI/acute pyelonephritis.  Urine cultures pending.  Patient status post cystoscopy with retrograde pyelogram and urethral stent placement 05/13/2018.  Renal  function trending down with hydration.  Hold nephrotoxins.  Continue IV fluids.  Follow.  8.  Acidosis Likely secondary to acute infection/sepsis and acute renal failure.  Continue IV fluids.  Acidosis slowly improving.  Patient has been pancultured.  Place on a bicarb drip.  Follow.  9.  Dysphagia Patient with complaints of difficulty swallowing solids complains that he feels it gets stuck.  Patient denies any difficulty with liquids.  Patient states symptoms have been ongoing for 2 to 3 days prior to admission.  Patient with history of peptic ulcer disease.  We will get a barium swallow.  If barium swallow is abnormal will formally consult with gastroenterology.  Case discussed with gastroenterology.  Continue PPI.  10.  Hypokalemia Magnesium is 2.3.  Replete.   DVT prophylaxis: SCDs. Code Status: Full Family Communication: Updated patient.  No family at bedside. Disposition Plan: Likely home when clinically improved, resolution of sepsis, transition to oral antibiotics and when okay with urology.   Consultants:  Urology: Dr. Tresa Moore 05/13/2018  Procedures:   CT abdomen and pelvis 05/12/2018  Cystoscopy with retrograde pyelogram and ureteral stent placement per interventional radiology, Dr. Pascal Lux 05/13/2018 and Dr Tresa Moore urology 05/13/2018.  Chest x-ray 05/22/2018  Antimicrobials:   IV Rocephin 05/13/2018   Subjective: Patient sleeping however easily arousable.  Patient with complaints of difficulty swallowing with solids states he feels like food gets stuck.  Patient denies any dysphagia to liquids.  No nausea or vomiting.  No hematemesis.  Still with some abdominal pain however improvement since admission but not at baseline.    Objective: Vitals:   05/13/18 1646 05/13/18 1917 05/14/18 0009 05/14/18 0519  BP: (!) 142/81 (!) 156/88 (!) 143/79 133/80  Pulse: (!) 108 92 84 75  Resp: (!) 24 18  16   Temp: 98.1 F (36.7 C) 97.9 F (36.6 C) 98 F (36.7 C) 98 F (36.7 C)  TempSrc: Oral  Oral    SpO2: 100% 100% 99% 99%  Weight:    87.4 kg  Height:        Intake/Output Summary (Last 24 hours) at 05/14/2018 1027 Last data filed at 05/14/2018 0500 Gross per 24 hour  Intake 2897.81 ml  Output 3850 ml  Net -952.19 ml   Filed Weights   05/13/18 0340 05/13/18 1018 05/14/18 0519  Weight: 87.3 kg 87.3 kg 87.4 kg    Examination:  General exam: NAD.  Dry mucous membranes.   Respiratory system: CTAB.  No wheezes, no crackles, rhonchi.  Respiratory effort normal. Cardiovascular system: Regular rate and rhythm no murmurs rubs or gallops.  No JVD.  No lower extremity edema.   Gastrointestinal system: Abdomen is soft, nondistended, positive bowel sounds.  Diffusely tender to palpation greater in the lower abdomen.  No rebound.  No guarding.  Central nervous system: Alert and oriented.  No focal neurological deficits. Extremities: Symmetric 5 x 5 power. Skin: No rashes, lesions or ulcers Psychiatry: Judgement and insight appear normal. Mood & affect appropriate.     Data Reviewed: I have personally reviewed following labs and imaging studies  CBC: Recent Labs  Lab 05/13/18 0254 05/13/18 0630 05/13/18 1240 05/13/18 1835 05/14/18 0801  WBC 18.4* 18.0* 14.0* 12.3* 10.7*  NEUTROABS  --   --   --   --  7.6  HGB 15.2 14.1 13.0 12.6*  12.6*  HCT 46.4 43.5 39.8 38.7* 38.9*  MCV 86.4 86.8 85.8 85.6 84.6  PLT 355 283 248 243 130   Basic Metabolic Panel: Recent Labs  Lab 05/12/18 1959 05/13/18 0630 05/13/18 1240 05/14/18 0801  NA 137 139 139 142  K 4.5 3.6 3.5 2.9*  CL 109 118* 117* 120*  CO2 9* 12* 14* 15*  GLUCOSE 193* 134* 141* 120*  BUN 25* 23* 19 11  CREATININE 1.79* 1.00 0.96 0.61  CALCIUM 9.2 7.9* 8.0* 8.5*  MG  --   --   --  2.3   GFR: Estimated Creatinine Clearance: 110.8 mL/min (by C-G formula based on SCr of 0.61 mg/dL). Liver Function Tests: Recent Labs  Lab 05/12/18 1959 05/14/18 0801  AST 12* 9*  ALT 17 12  ALKPHOS 92 59  BILITOT 1.4* 0.7    PROT 7.4 5.3*  ALBUMIN 4.4 3.0*   Recent Labs  Lab 05/12/18 1959  LIPASE 31   No results for input(s): AMMONIA in the last 168 hours. Coagulation Profile: Recent Labs  Lab 05/13/18 0048  INR 1.13   Cardiac Enzymes: No results for input(s): CKTOTAL, CKMB, CKMBINDEX, TROPONINI in the last 168 hours. BNP (last 3 results) No results for input(s): PROBNP in the last 8760 hours. HbA1C: No results for input(s): HGBA1C in the last 72 hours. CBG: Recent Labs  Lab 05/13/18 0743 05/13/18 1121 05/13/18 1616 05/13/18 2101 05/14/18 0732  GLUCAP 129* 134* 131* 146* 128*   Lipid Profile: No results for input(s): CHOL, HDL, LDLCALC, TRIG, CHOLHDL, LDLDIRECT in the last 72 hours. Thyroid Function Tests: No results for input(s): TSH, T4TOTAL, FREET4, T3FREE, THYROIDAB in the last 72 hours. Anemia Panel: No results for input(s): VITAMINB12, FOLATE, FERRITIN, TIBC, IRON, RETICCTPCT in the last 72 hours. Sepsis Labs: Recent Labs  Lab 05/13/18 0048 05/13/18 0254  PROCALCITON <0.10  --   LATICACIDVEN 0.8 1.1    Recent Results (from the past 240 hour(s))  Culture, blood (Routine X 2) w Reflex to ID Panel     Status: None (Preliminary result)   Collection Time: 05/13/18 12:48 AM  Result Value Ref Range Status   Specimen Description BLOOD LEFT ANTECUBITAL  Final   Special Requests   Final    BOTTLES DRAWN AEROBIC AND ANAEROBIC Blood Culture adequate volume   Culture   Final    NO GROWTH 1 DAY Performed at [facility], [place] [address] [place], [place] [address]    Report Status PENDING  Incomplete  Culture, blood (Routine X 2) w Reflex to ID Panel     Status: None (Preliminary result)   Collection Time: 05/13/18 12:58 AM  Result Value Ref Range Status   Specimen Description BLOOD LEFT ANTECUBITAL  Final   Special Requests   Final    BOTTLES DRAWN AEROBIC AND ANAEROBIC Blood Culture adequate volume   Culture   Final    NO GROWTH 1 DAY Performed at [facility], [place] [address] [place], [place] [address]    Report Status PENDING  Incomplete         Radiology Studies: Ct Abdomen Pelvis W Contrast  Result Date: 05/12/2018 CLINICAL DATA:  Abdominal pain and neutropenia EXAM: CT ABDOMEN AND PELVIS WITH CONTRAST TECHNIQUE: Multidetector CT imaging of the abdomen and pelvis was performed using the standard protocol following bolus administration of intravenous contrast. CONTRAST:  168m ISOVUE-300 IOPAMIDOL (ISOVUE-300) INJECTION 61% COMPARISON:  CT abdomen pelvis 03/29/2014 FINDINGS: LOWER CHEST: There is no basilar pleural or  apical pericardial effusion. HEPATOBILIARY: The hepatic contours and density are normal. There is no intra- or extrahepatic biliary dilatation. Status post cholecystectomy. PANCREAS: The pancreatic parenchymal contours are normal and there is no ductal dilatation. There is no peripancreatic fluid collection. SPLEEN: Normal. ADRENALS/URINARY TRACT: --Adrenal glands: Normal. --Right kidney/ureter: There are multiple nonobstructing renal calculi, measuring up to 5 mm. No hydronephrosis. --Left kidney/ureter: There is an obstructing stone within the proximal left ureter that measures 7 x 8 x 10 mm. There is mild proximal hydroureter with minimal perinephric edema. There are multiple other left renal calculi that measure up to 9 mm. --Urinary bladder: Markedly distended. STOMACH/BOWEL: --Stomach/Duodenum: There is no hiatal hernia or other gastric abnormality. The duodenal course and caliber are normal. --Small bowel: No dilatation or inflammation. --Colon: No focal abnormality. --Appendix: Normal. VASCULAR/LYMPHATIC: Normal course and caliber of the major abdominal vessels. No abdominal or pelvic lymphadenopathy. REPRODUCTIVE: Prostate is enlarged measuring 5.2 cm in transverse dimension, with calcifications. MUSCULOSKELETAL. Compression deformities of T12, L1 and L2 are unchanged. OTHER: None. IMPRESSION: 1. Left obstructive uropathy with 7 x 8 x  10 mm stone in the proximal left ureter causing mild hydroureter and pelviectasis. 2. Multiple bilateral nonobstructive renal calculi measuring up to 5 mm on the right and 9 mm on the left. 3. Enlarged prostate and markedly distended urinary bladder. 4. Chronic compression deformities of T12, L1 and L2. Electronically Signed   By: Ulyses Jarred M.D.   On: 05/12/2018 23:18   Dg Retrograde Pyelogram  Result Date: 05/13/2018 CLINICAL DATA:  Cystoscopy with retrograde pyelogram and ureteral stent placement. EXAM: RETROGRADE PYELOGRAM COMPARISON:  CT abdomen and pelvis-05/12/2018 FLUOROSCOPY TIME:  49 seconds FINDINGS: 8 spot intraoperative fluoroscopic images during bilateral retrograde pyelogram and ureteral stent placement are provided for review Initial image demonstrates selective cannulation of the left ureter. There is a persistent near occlusive filling defect within the proximal aspect the left ureter, likely compatible with the partially obstructing ureteral stone demonstrated on preceding abdominal CT. Note is again made of mild upstream left-sided pelvicaliectasis. Subsequent image demonstrates placement of a left-sided double-J ureteral stent with superior coil overlying the left renal pelvis and inferior coil overlying the urinary bladder. Subsequent images demonstrate selective cannulation opacification of the right ureter. There are no discrete filling defects within the opacified portions of the right renal collecting system. No definite evidence of pelvicaliectasis given contrast injection. Subsequent images demonstrate placement of a double-J ureteral stent with superior coil overlying the right renal pelvis and inferior coil overlying the urinary bladder. IMPRESSION: Bilateral retrograde pyelogram and ureteral stent placement as above. Electronically Signed   By: Sandi Mariscal M.D.   On: 05/13/2018 07:43   Dg Chest Portable 1 View  Result Date: 05/12/2018 CLINICAL DATA:  Hematemesis EXAM: PORTABLE  CHEST 1 VIEW COMPARISON:  Portable exam 2027 hours compared to 03/29/2014 FINDINGS: Normal heart size, mediastinal contours, and pulmonary vascularity. Lungs clear. No pulmonary infiltrate, pleural effusion or pneumothorax. No free air under the diaphragms. Old healed LEFT rib fractures. Bones demineralized. IMPRESSION: No acute abnormalities. Electronically Signed   By: Lavonia Dana M.D.   On: 05/12/2018 21:05        Scheduled Meds: . feeding supplement (GLUCERNA SHAKE)  237 mL Oral TID BM  . ferrous sulfate  325 mg Oral BID  . Influenza vac split quadrivalent PF  0.5 mL Intramuscular Tomorrow-1000  . loratadine  10 mg Oral Daily  . magnesium oxide  400 mg Oral BID  . mirtazapine  15  mg Oral QHS  . multivitamin with minerals  1 tablet Oral Daily  . pantoprazole  40 mg Intravenous Q12H  . polyethylene glycol  17 g Oral Daily  . potassium chloride  40 mEq Oral Q4H  . risperiDONE  1 mg Oral BID  . senna-docusate  1 tablet Oral BID  . tamsulosin  0.4 mg Oral Daily   Continuous Infusions: . cefTRIAXone (ROCEPHIN)  IV 2 g (05/14/18 0100)  . dextrose 5 % 1,000 mL with sodium bicarbonate 150 mEq infusion       LOS: 1 day    Time spent: 40 minutes    Irine Seal, MD [facility] Pager [phone]  If 7PM-7AM, please contact night-coverage [email or web address] Password [facility] 05/14/2018, 10:27 AM

## 2018-05-15 ENCOUNTER — Inpatient Hospital Stay (HOSPITAL_COMMUNITY): Payer: No Typology Code available for payment source | Admitting: Certified Registered"

## 2018-05-15 ENCOUNTER — Encounter (HOSPITAL_COMMUNITY): Payer: Self-pay | Admitting: Certified Registered"

## 2018-05-15 ENCOUNTER — Encounter (HOSPITAL_COMMUNITY)
Admission: EM | Disposition: A | Discharge status: Home / Self Care | Payer: Self-pay | Source: Home / Self Care | Attending: Internal Medicine

## 2018-05-15 ENCOUNTER — Inpatient Hospital Stay (HOSPITAL_COMMUNITY): Payer: No Typology Code available for payment source

## 2018-05-15 DIAGNOSIS — N179 Acute kidney failure, unspecified: Secondary | ICD-10-CM

## 2018-05-15 DIAGNOSIS — Z720 Tobacco use: Secondary | ICD-10-CM

## 2018-05-15 DIAGNOSIS — R1310 Dysphagia, unspecified: Secondary | ICD-10-CM

## 2018-05-15 DIAGNOSIS — K920 Hematemesis: Secondary | ICD-10-CM

## 2018-05-15 DIAGNOSIS — N10 Acute tubulo-interstitial nephritis: Secondary | ICD-10-CM

## 2018-05-15 DIAGNOSIS — N132 Hydronephrosis with renal and ureteral calculous obstruction: Secondary | ICD-10-CM

## 2018-05-15 DIAGNOSIS — K210 Gastro-esophageal reflux disease with esophagitis: Secondary | ICD-10-CM

## 2018-05-15 DIAGNOSIS — I10 Essential (primary) hypertension: Secondary | ICD-10-CM

## 2018-05-15 DIAGNOSIS — A419 Sepsis, unspecified organism: Principal | ICD-10-CM

## 2018-05-15 DIAGNOSIS — I5030 Unspecified diastolic (congestive) heart failure: Secondary | ICD-10-CM

## 2018-05-15 DIAGNOSIS — E860 Dehydration: Secondary | ICD-10-CM

## 2018-05-15 DIAGNOSIS — N390 Urinary tract infection, site not specified: Secondary | ICD-10-CM

## 2018-05-15 HISTORY — PX: ESOPHAGOGASTRODUODENOSCOPY (EGD) WITH PROPOFOL: SHX5813

## 2018-05-15 HISTORY — PX: BIOPSY: SHX5522

## 2018-05-15 LAB — COMPREHENSIVE METABOLIC PANEL
ALT: 14 U/L (ref 0–44)
ANION GAP: 8 (ref 5–15)
AST: 13 U/L — ABNORMAL LOW (ref 15–41)
Albumin: 3.2 g/dL — ABNORMAL LOW (ref 3.5–5.0)
Alkaline Phosphatase: 60 U/L (ref 38–126)
BILIRUBIN TOTAL: 0.4 mg/dL (ref 0.3–1.2)
BUN: 7 mg/dL (ref 6–20)
CHLORIDE: 110 mmol/L (ref 98–111)
CO2: 24 mmol/L (ref 22–32)
Calcium: 8.6 mg/dL — ABNORMAL LOW (ref 8.9–10.3)
Creatinine, Ser: 0.6 mg/dL — ABNORMAL LOW (ref 0.61–1.24)
GFR calc Af Amer: >60 mL/min (ref >60)
GFR calc non Af Amer: >60 mL/min (ref >60)
GLUCOSE: 117 mg/dL — AB (ref 70–99)
POTASSIUM: 3.1 mmol/L — AB (ref 3.5–5.1)
SODIUM: 142 mmol/L (ref 135–145)
TOTAL PROTEIN: 5.6 g/dL — AB (ref 6.5–8.1)

## 2018-05-15 LAB — CBC WITH DIFFERENTIAL/PLATELET
ABS IMMATURE GRANULOCYTES: 0 10*3/uL (ref 0.0–0.1)
BASOS ABS: 0 10*3/uL (ref 0.0–0.1)
BASOS PCT: 0 %
EOS ABS: 0.1 10*3/uL (ref 0.0–0.7)
Eosinophils Relative: 1 %
HCT: 36.3 % — ABNORMAL LOW (ref 39.0–52.0)
Hemoglobin: 12.5 g/dL — ABNORMAL LOW (ref 13.0–17.0)
Immature Granulocytes: 0 %
Lymphocytes Relative: 22 %
Lymphs Abs: 2.3 10*3/uL (ref 0.7–4.0)
MCH: 28.3 pg (ref 26.0–34.0)
MCHC: 34.4 g/dL (ref 30.0–36.0)
MCV: 82.1 fL (ref 78.0–100.0)
MONO ABS: 1 10*3/uL (ref 0.1–1.0)
Monocytes Relative: 10 %
NEUTROS ABS: 6.8 10*3/uL (ref 1.7–7.7)
NEUTROS PCT: 67 %
PLATELETS: 243 10*3/uL (ref 150–400)
RBC: 4.42 MIL/uL (ref 4.22–5.81)
RDW: 14 % (ref 11.5–15.5)
WBC: 10.2 10*3/uL (ref 4.0–10.5)

## 2018-05-15 LAB — ECHOCARDIOGRAM COMPLETE
HEIGHTINCHES: 68 in
WEIGHTICAEL: 3116.8 [oz_av]

## 2018-05-15 LAB — GLUCOSE, CAPILLARY
GLUCOSE-CAPILLARY: 138 mg/dL — AB (ref 70–99)
GLUCOSE-CAPILLARY: 127 mg/dL — AB (ref 70–99)
Glucose-Capillary: 145 mg/dL — ABNORMAL HIGH (ref 70–99)
Glucose-Capillary: 129 mg/dL — ABNORMAL HIGH (ref 70–99)

## 2018-05-15 LAB — TROPONIN I

## 2018-05-15 SURGERY — ESOPHAGOGASTRODUODENOSCOPY (EGD) WITH PROPOFOL
Anesthesia: Monitor Anesthesia Care

## 2018-05-15 MED ORDER — SODIUM BICARBONATE 8.4 % IV SOLN
INTRAVENOUS | Status: DC
  Administered 2018-05-15 – 2018-05-16 (×2): via INTRAVENOUS
  Filled 2018-05-15 (×4): qty 1000

## 2018-05-15 MED ORDER — LIDOCAINE 2% (20 MG/ML) 5 ML SYRINGE
INTRAMUSCULAR | Status: DC | PRN
  Administered 2018-05-15: 100 mg via INTRAVENOUS

## 2018-05-15 MED ORDER — POTASSIUM CHLORIDE 10 MEQ/100ML IV SOLN
10.0000 meq | INTRAVENOUS | Status: AC
  Administered 2018-05-15 (×3): 10 meq via INTRAVENOUS
  Filled 2018-05-15 (×3): qty 100

## 2018-05-15 MED ORDER — PERFLUTREN LIPID MICROSPHERE
INTRAVENOUS | Status: AC
  Administered 2018-05-15: 0.4 mg
  Filled 2018-05-15: qty 10

## 2018-05-15 MED ORDER — SODIUM CHLORIDE 0.9 % IV SOLN: INTRAVENOUS | Status: DC

## 2018-05-15 MED ORDER — SUCRALFATE 1 GM/10ML PO SUSP
1.0000 g | Freq: Three times a day (TID) | ORAL | Status: DC
  Administered 2018-05-15 – 2018-05-16 (×4): 1 g via ORAL
  Filled 2018-05-15 (×4): qty 10

## 2018-05-15 MED ORDER — LACTATED RINGERS IV SOLN
INTRAVENOUS | Status: DC | PRN
  Administered 2018-05-15: 14:00:00 via INTRAVENOUS

## 2018-05-15 MED ORDER — PROPOFOL 10 MG/ML IV BOLUS
INTRAVENOUS | Status: DC | PRN
  Administered 2018-05-15: 40 mg via INTRAVENOUS
  Administered 2018-05-15: 25 mg via INTRAVENOUS

## 2018-05-15 MED ORDER — PERFLUTREN LIPID MICROSPHERE
4.0000 mL | Freq: Once | INTRAVENOUS | Status: AC
  Filled 2018-05-15: qty 4

## 2018-05-15 MED ORDER — PROPOFOL 500 MG/50ML IV EMUL
INTRAVENOUS | Status: DC | PRN
  Administered 2018-05-15: 200 ug/kg/min via INTRAVENOUS

## 2018-05-15 SURGICAL SUPPLY — 15 items

## 2018-05-15 NOTE — Anesthesia Procedure Notes (Signed)
Procedure Name: MAC Date/Time: 05/15/2018 2:29 PM Performed by: Imagene Riches, CRNA Pre-anesthesia Checklist: Patient identified, Emergency Drugs available, Suction available and Patient being monitored Patient Re-evaluated:Patient Re-evaluated prior to induction Oxygen Delivery Method: Nasal cannula Preoxygenation: Pre-oxygenation with 100% oxygen

## 2018-05-15 NOTE — Progress Notes (Signed)
  Echocardiogram 2D Echocardiogram has been performed.  Belva Chimes 05/15/2018, 2:42 PM

## 2018-05-15 NOTE — Op Note (Signed)
[facility] Patient Name: Brian Cisneros Procedure Date : 05/15/2018 MRN: [id number] Attending MD: Graylin Shiver , MD Date of Birth: 03-04-1961 CSN: [id number] Age: 57 Admit Type: Inpatient Procedure:                Upper GI endoscopy Indications:              Dysphagia Providers:                Graylin Shiver, MD, Tomma Rakers, RN, Verita Schneiders, Technician Referring MD:              Medicines:                Propofol per Anesthesia Complications:            No immediate complications. Estimated Blood Loss:     Estimated blood loss: none. Procedure:                Pre-Anesthesia Assessment:                           - Prior to the procedure, a History and Physical                            was performed, and patient medications and                            allergies were reviewed. The patient's tolerance of                            previous anesthesia was also reviewed. The risks                            and benefits of the procedure and the sedation                            options and risks were discussed with the patient.                            All questions were answered, and informed consent                            was obtained. Prior Anticoagulants: The patient has                            taken no previous anticoagulant or antiplatelet                            agents. ASA Grade Assessment: II - A patient with                            mild systemic disease. After reviewing the risks  and benefits, the patient was deemed in                            satisfactory condition to undergo the procedure.                           After obtaining informed consent, the endoscope was                            passed under direct vision. Throughout the                            procedure, the patient's blood pressure, pulse, and                            oxygen saturations were monitored  continuously. The                            [id number]-[id number] ([id number]) Olympus Adult EGD was introduced                            through the mouth, and advanced to the second part                            of duodenum. The upper GI endoscopy was                            accomplished without difficulty. The patient                            tolerated the procedure well. Scope In: Scope Out: Findings:      LA Grade D (one or more mucosal breaks involving at least 75% of       esophageal circumference) esophagitis was found. Biopsies were taken       with a cold forceps for histology. Severe ulcerative esophagitis from       30-39cm. No fixed stricture.      The entire examined stomach was normal.      One cratered duodenal ulcer was found in the first portion of the       duodenum. Just past the bulb. The lesion was 20 mm in largest dimension.       No vissible vessel. Biopsies were taken with a cold forceps for       histology. Impression:               - LA Grade D reflux esophagitis. Severe ulcerative.                            Biopsied. No fixed stricture.                           - Normal stomach.                           - One duodenal ulcer. Biopsied. Recommendation:           -  Clear liquid diet today.                           - Continue present medications.                           - PPI, check bx for H pylori. Procedure Code(s):        --- Professional ---                           [id number], Esophagogastroduodenoscopy, flexible,                            transoral; with biopsy, single or multiple Diagnosis Code(s):        --- Professional ---                           K21.0, Gastro-esophageal reflux disease with                            esophagitis                           K26.9, Duodenal ulcer, unspecified as acute or                            chronic, without hemorrhage or perforation                           R13.10, Dysphagia, unspecified CPT copyright 2017  American [employer]. All rights reserved. The codes documented in this report are preliminary and upon coder review may  be revised to meet current compliance requirements. Graylin Shiver, MD 05/15/2018 2:52:00 PM This report has been signed electronically. Number of Addenda: 0

## 2018-05-15 NOTE — Consult Note (Signed)
Subjective:   HPI  The patient is a 57 year old male with multiple medical problems who we are asked to see in regards to dysphagia. The dysphagia began 4 or 5 days ago. He feels as though food is sticking in the mid chest area. A barium swallow was done yesterday showing prominent dysmotility in the distal third of the esophagus beyond which a barium tablet would not pass. A recent abdominal CT suggested esophageal wall thickening in this same region. Esophagitis is a consideration but endoscopy was recommended to further investigate .he also does complain of heartburn.     Past Medical History:  Diagnosis Date  . Depression    PTSD per mother  . Hypertension    Past Surgical History:  Procedure Laterality Date  . CYSTOSCOPY/RETROGRADE/URETEROSCOPY/STONE EXTRACTION WITH BASKET Right 03/29/2014   Procedure: CYSTOSCOPY/RETROGRADE/URETEROSCOPY/STONE EXTRACTION WITH BASKET, STENT PLACEMENT;  Surgeon: Kathi Ludwig, MD;  Location: [facility];  Service: Urology;  Laterality: Right;   Social History   Socioeconomic History  . Marital status: Single    Spouse name: Not on file  . Number of children: Not on file  . Years of education: Not on file  . Highest education level: Not on file  Occupational History  . Not on file  Social Needs  . Financial resource strain: Not on file  . Food insecurity:    Worry: Not on file    Inability: Not on file  . Transportation needs:    Medical: Not on file    Non-medical: Not on file  Tobacco Use  . Smoking status: Former Smoker    Types: Cigarettes  . Smokeless tobacco: Never Used  . Tobacco comment: he quit smoking 1 month ago  Substance and Sexual Activity  . Alcohol use: Yes    Comment: few times a week  . Drug use: No  . Sexual activity: Not on file  Lifestyle  . Physical activity:    Days per week: Not on file    Minutes per session: Not on file  . Stress: Not on file  Relationships  . Social connections:    Talks on phone: Not  on file    Gets together: Not on file    Attends religious service: Not on file    Active member of club or organization: Not on file    Attends meetings of clubs or organizations: Not on file    Relationship status: Not on file  . Intimate partner violence:    Fear of current or ex partner: Not on file    Emotionally abused: Not on file    Physically abused: Not on file    Forced sexual activity: Not on file  Other Topics Concern  . Not on file  Social History Narrative  . Not on file   family history includes Arthritis/Rheumatoid in his sister; Diabetes Mellitus II in his mother and sister.  Current Facility-Administered Medications:  .  acetaminophen (TYLENOL) tablet 650 mg, 650 mg, Oral, Q6H PRN **OR** acetaminophen (TYLENOL) suppository 650 mg, 650 mg, Rectal, Q6H PRN, Lorretta Harp, MD .  cefTRIAXone (ROCEPHIN) 2 g in sodium chloride 0.9 % 100 mL IVPB, 2 g, Intravenous, Q24H, Lorretta Harp, MD, Last Rate: 200 mL/hr at 05/15/18 0120, 2 g at 05/15/18 0120 .  dextrose 5 % 1,000 mL with sodium bicarbonate 150 mEq infusion, , Intravenous, Continuous, Rodolph Bong, MD, Last Rate: 125 mL/hr at 05/15/18 0117 .  feeding supplement (GLUCERNA SHAKE) (GLUCERNA SHAKE) liquid 237 mL, 237 mL, Oral,  TID BM, Mack Hook, MD, 237 mL at 05/14/18 1954 .  ferrous sulfate tablet 325 mg, 325 mg, Oral, BID, Lorretta Harp, MD, 325 mg at 05/14/18 1046 .  gi cocktail (Maalox,Lidocaine,Donnatal), 30 mL, Oral, TID PRN, Rodolph Bong, MD, 30 mL at 05/14/18 1158 .  hydrALAZINE (APRESOLINE) injection 5 mg, 5 mg, Intravenous, Q2H PRN, Lorretta Harp, MD .  Influenza vac split quadrivalent PF (FLUARIX) injection 0.5 mL, 0.5 mL, Intramuscular, Tomorrow-1000, Niu, Xilin, MD .  loratadine (CLARITIN) tablet 10 mg, 10 mg, Oral, Daily, Lorretta Harp, MD, 10 mg at 05/14/18 1045 .  magnesium oxide (MAG-OX) tablet 400 mg, 400 mg, Oral, BID, Lorretta Harp, MD, 400 mg at 05/14/18 1045 .  mirtazapine (REMERON) tablet 15 mg, 15 mg,  Oral, QHS, Lorretta Harp, MD, 15 mg at 05/13/18 2146 .  morphine 2 MG/ML injection 2 mg, 2 mg, Intravenous, Q4H PRN, Rodolph Bong, MD, 2 mg at 05/14/18 1121 .  multivitamin with minerals tablet 1 tablet, 1 tablet, Oral, Daily, Mack Hook, MD, 1 tablet at 05/14/18 1046 .  nicotine (NICODERM CQ - dosed in mg/24 hours) patch 21 mg, 21 mg, Transdermal, Daily PRN, Lorretta Harp, MD, 21 mg at 05/13/18 0402 .  nitroGLYCERIN (NITROSTAT) SL tablet 0.4 mg, 0.4 mg, Sublingual, Q5 min PRN, Rodolph Bong, MD, 0.4 mg at 05/14/18 1121 .  ondansetron (ZOFRAN) tablet 4 mg, 4 mg, Oral, Q6H PRN **OR** ondansetron (ZOFRAN) injection 4 mg, 4 mg, Intravenous, Q6H PRN, Lorretta Harp, MD .  oxyCODONE (Oxy IR/ROXICODONE) immediate release tablet 5 mg, 5 mg, Oral, Q6H PRN, Rodolph Bong, MD, 5 mg at 05/13/18 2157 .  pantoprazole (PROTONIX) injection 40 mg, 40 mg, Intravenous, Q12H, Lorretta Harp, MD, 40 mg at 05/14/18 2159 .  polyethylene glycol (MIRALAX / GLYCOLAX) packet 17 g, 17 g, Oral, Daily, Rodolph Bong, MD, 17 g at 05/14/18 1046 .  risperiDONE (RISPERDAL) tablet 1 mg, 1 mg, Oral, BID, Lorretta Harp, MD, 1 mg at 05/14/18 1046 .  senna-docusate (Senokot-S) tablet 1 tablet, 1 tablet, Oral, BID, Rodolph Bong, MD, 1 tablet at 05/14/18 1046 .  tamsulosin (FLOMAX) capsule 0.4 mg, 0.4 mg, Oral, Daily, Lorretta Harp, MD, 0.4 mg at 05/14/18 1045 .  zolpidem (AMBIEN) tablet 10 mg, 10 mg, Oral, QHS PRN, Lorretta Harp, MD No Known Allergies   Objective:     BP (!) 145/72   Pulse 77   Temp 98 F (36.7 C)   Resp 18   Ht 5\' 8"  (1.727 m)   Wt 88.4 kg Comment: c  SpO2 95%   BMI 29.62 kg/m   He is in no distress  Heart regular rhythm no murmurs  Lungs clear  Abdomen soft and nontender    Laboratory No components found for: D1    Assessment:     Dysphagia  Gastroesophageal reflux      Plan:     We will plan EGD with possible dilatation of the stricture is found later today. Continue PPI  therapy.

## 2018-05-15 NOTE — H&P (Signed)
Patient presents to the endoscopy unit for EGD with possible dilatation secondary to recent onset of dysphagia.  Physical no distress  Heart regular rhythm no murmurs  Lungs clear  Abdomen soft nontender  Impression dysphagia of uncertain etiology  Plan EGD with possible dilatation

## 2018-05-15 NOTE — Transfer of Care (Signed)
Immediate Anesthesia Transfer of Care Note  Patient: Quantel Mcinturff  Procedure(s) Performed: ESOPHAGOGASTRODUODENOSCOPY (EGD) WITH PROPOFOL (N/A ) BIOPSY  Patient Location: Endoscopy Unit  Anesthesia Type:MAC  Level of Consciousness: drowsy  Airway & Oxygen Therapy: Patient Spontanous Breathing and Patient connected to nasal cannula oxygen  Post-op Assessment: Report given to RN and Post -op Vital signs reviewed and stable  Post vital signs: Reviewed and stable  Last Vitals:  Vitals Value Taken Time  BP    Temp    Pulse    Resp    SpO2      Last Pain:  Vitals:   05/15/18 1411  TempSrc: Oral  PainSc: 0-No pain         Complications: No apparent anesthesia complications

## 2018-05-15 NOTE — Progress Notes (Signed)
Patient slept through the night. Vitals are stable and no complaints of pain.  Elsie Lincoln, RN

## 2018-05-15 NOTE — Anesthesia Preprocedure Evaluation (Addendum)
Anesthesia Evaluation  Patient identified by MRN, date of birth, ID band Patient awake    Reviewed: Allergy & Precautions, NPO status , Patient's Chart, lab work & pertinent test results  Airway Mallampati: I  TM Distance: >3 FB Neck ROM: Full    Dental  (+) Edentulous Upper, Chipped,    Pulmonary former smoker,    Pulmonary exam normal breath sounds clear to auscultation       Cardiovascular hypertension, Pt. on medications Normal cardiovascular exam Rhythm:Regular Rate:Normal     Neuro/Psych Depression negative neurological ROS  negative psych ROS   GI/Hepatic negative GI ROS, Neg liver ROS, GERD  ,  Endo/Other  negative endocrine ROS  Renal/GU negative Renal ROS  negative genitourinary   Musculoskeletal negative musculoskeletal ROS (+)   Abdominal   Peds  Hematology negative hematology ROS (+)   Anesthesia Other Findings   Reproductive/Obstetrics                            Anesthesia Physical Anesthesia Plan  ASA: III  Anesthesia Plan: MAC   Post-op Pain Management:    Induction: Intravenous  PONV Risk Score and Plan: 1 and Treatment may vary due to age or medical condition  Airway Management Planned:   Additional Equipment:   Intra-op Plan:   Post-operative Plan:   Informed Consent: I have reviewed the patients History and Physical, chart, labs and discussed the procedure including the risks, benefits and alternatives for the proposed anesthesia with the patient or authorized representative who has indicated his/her understanding and acceptance.   Dental advisory given  Plan Discussed with: CRNA  Anesthesia Plan Comments:         Anesthesia Quick Evaluation

## 2018-05-15 NOTE — Progress Notes (Signed)
PROGRESS NOTE    Brian Cisneros  KCL:[id number] DOB: 1960-11-30 DOA: 05/12/2018 PCP: [facility]    Brief Narrative:  Brian Cisneros is a 57 y.o. male with medical history significant of hypertension, former smoker (quit month ago), GERD, depression, who presents with nausea, vomiting, lower abdominal pain and hematemesis.  Patient states that he has been having nausea, vomiting and lower abdominal pain for more than 4 days, which has worsened today.  He states that he vomited at least 5 times, sometimes with dark bloody materials.  His abdominal pain is located in the lower abdomen, constant, sharp, 8 out of 10 severity, nonradiating.  He states that he has a burning on urination, but denies dysuria or increased urinary frequency.  He has subjective fever and chills.  His temperature is normal 97.6 in ED.  Patient does not have chest pain, shortness breath, cough.  No unilateral weakness.  Per his mother, patient was treated with antibiotics for UTI 3 weeks ago, but they did not know the name of antibiotics.  ED Course: pt was found to have WBC 23.5, AKI with creatinine 1.79, lipase 31, urinalysis (negative for leukocytes, rare bacteria, clear appearance, WBC 11-20), temperature normal, tachycardia, tachypnea, oxygen saturation 97% on room air, temperature 97.6.  CT abdomen/pelvis showed: 1. Left obstructive uropathy with 7 x 8 x 10 mm stone in the proximal left ureter causing mild hydroureter and pelviectasis. 2. Multiple bilateral nonobstructive renal calculi measuring up to 5 mm on the right and 9 mm on the left. 3. Enlarged prostate and markedly distended urinary bladder. 4. Chronic compression deformities of T12, L1 and L2.    Assessment & Plan:   Principal Problem:   Sepsis due to urinary tract infection ([place]) Active Problems:   Ureteral stone with hydronephrosis   Tobacco abuse   Essential hypertension   GERD (gastroesophageal reflux disease)   Depression  Hematemesis   AKI (acute kidney injury) ([place])   Sepsis ([place])   Acute pyelonephritis   Dehydration   Dysphagia  #1 sepsis secondary to pyelonephritis/UTI/Infected stone secondary to obstructive urethral stone with hydronephrosis -Patient on admission met criteria for sepsis with leukocytosis, tachycardia, tachypnea, urinalysis with pyuria with a WBC of 11-20, patient also noted to be acidotic.   -Patient pancultured.  -Patient seen in consultation by urology and underwent cystoscopy with retrograde pyelogram and urethral stent placement 05/13/2018.   -WBC's now WNL, no fever and otherwise feeling better. -transition to PO abx's in am and most likely discharge home if he remains stable.  2.  Dehydration -improved/resolved with IVF's -now on clear liquid diet; once diet further advance will adjust IVF rate.  3.  Hypertension -BP stable -will follow VS and slowly resume antihypertensive regimen -continue PRN hydralazine currently.  4.  Tobacco abuse -I have discussed tobacco cessation with the patient.  I have counseled the patient regarding the negative impacts of continued tobacco use including but not limited to lung cancer, COPD, and cardiovascular disease.  I have discussed alternatives to tobacco and modalities that may help facilitate tobacco cessation including but not limited to biofeedback, hypnosis, and medications. Total time spent with tobacco counseling was 5 minutes. -continue nicotine patch.  5.  Hematemesis/nausea/vomiting/abdominal pain -Likely secondary to problem #1.   -Hematemesis may have been due to Mallory-Weiss tear and esophagitis. -No ongoing hematemesis appreciated -s/p EGD on 05/15/18; will continue PPI -continue PRN antiemetics  6.  Gastroesophageal reflux disease -continue PPI BID -start carafate.  7.  Acute renal failure -most likely  secondary to obstructive uropathy and a prerenal azotemia in the setting of UTI/acute pyelonephritis.   -continue  rocephin -patient s/p cystoscopy with retrograde pyelogram and urethral stent placement 05/13/2018.   -Cr back to normal now  -will need outpatient definitive treatment for nephrolithiasis Renal function trending down with hydration.  Hold nephrotoxins.  Continue IV fluids.  Follow.  8.  Acidosis -Likely secondary to acute infection/sepsis and acute renal failure.   -Patient has been pancultured.   -improved/resolved after receiving treatment with bicarb drip.   -adjust IVF's rate and wean off bicarb.  9.  Dysphagia/severe ulcerative esophagitis, gastritis and duodenal ulcer -Patient with complaints of difficulty swallowing solids complains that he feels it gets stuck.   -Status post endoscopy by Dr. Penelope Coop that demonstrated severe ulcerative esophagitis and duodenal ulcer there was also some ongoing gastritis.  -Clear liquid diet overnight and depending clinical response further advancement in a.m. -Continue PPI twice a day and start Carafate.    10.  Hypokalemia -K 3.1 -will replete and follow electrolytes trend -Magnesium will be checked in a.m. along with basic metabolic panel.   DVT prophylaxis: SCDs. Code Status: Full Family Communication: Updated patient.  No family at bedside. Disposition Plan: Likely home when clinically improved, resolution of sepsis, transition to oral antibiotics and when okay with urology and GI. Most likely 9/12.   Consultants:  Urology: Dr. Tresa Moore 05/13/2018  Procedures:   CT abdomen and pelvis 05/12/2018  Cystoscopy with retrograde pyelogram and ureteral stent placement per interventional radiology, Dr. Pascal Lux 05/13/2018 and Dr Tresa Moore urology 05/13/2018.  Chest x-ray 05/22/2018  Endoscopy 05/15/2018: Dr. Penelope Coop  Antimicrobials:   IV Rocephin 05/13/2018   Subjective: Afebrile, no further chest pain, no nausea, no vomiting.  Tolerated endoscopy procedure well and is in no acute distress. Patient is hungry and asking for something to  eat.  Objective: Vitals:   05/15/18 1214 05/15/18 1411 05/15/18 1450 05/15/18 1455  BP: 116/71 136/88 (!) 125/96 140/81  Pulse: 71  71 73  Resp: (!) 22 (!) _0 Temp: 98.2 F (36.8 C) 98.5 F (36.9 C) 99 F (37.2 C)   TempSrc: Oral Oral Oral   SpO2:  95% 98% 99%  Weight:      Height:        Intake/Output Summary (Last 24 hours) at 05/15/2018 1655 Last data filed at 05/15/2018 1441 Gross per 24 hour  Intake 895 ml  Output 500 ml  Net 395 ml   Filed Weights   05/13/18 1018 05/14/18 0519 05/15/18 0439  Weight: 87.3 kg 87.4 kg 88.4 kg    Examination: General exam: Alert, awake, oriented x 3; reports no chest pain, no nausea, no vomiting.  He is a very hungry and asking for something to eat.  At this moment he denies any significant abdominal discomfort and is in no acute distress. Respiratory system: Clear to auscultation. Respiratory effort normal. Cardiovascular system:RRR. No murmurs, rubs, gallops. Gastrointestinal system: Abdomen is nondistended, soft and just mild discomfort in epigastric pain with deep palpation. No organomegaly or masses felt. Normal bowel sounds heard. Central nervous system: Alert and oriented. No focal neurological deficits. Extremities: No C/C/E, +pedal pulses Skin: No rashes, lesions or ulcers Psychiatry: Judgement and insight appear normal. Mood & affect appropriate.   Data Reviewed: I have personally reviewed following labs and imaging studies  CBC: Recent Labs  Lab 05/13/18 0630 05/13/18 1240 05/13/18 1835 05/14/18 0801 05/15/18 0027  WBC 18.0* 14.0* 12.3* 10.7* 10.2  NEUTROABS  --   --   --  7.6 6.8  HGB 14.1 13.0 12.6* 12.6* 12.5*  HCT 43.5 39.8 38.7* 38.9* 36.3*  MCV 86.8 85.8 85.6 84.6 82.1  PLT 283 248 243 222 500   Basic Metabolic Panel: Recent Labs  Lab 05/12/18 1959 05/13/18 0630 05/13/18 1240 05/14/18 0801 05/15/18 0027  NA 137 139 139 142 142  K 4.5 3.6 3.5 2.9* 3.1*  CL 109 118* 117* 120* 110  CO2 9* 12*  14* 15* 24  GLUCOSE 193* 134* 141* 120* 117*  BUN 25* 23* _0 CREATININE 1.79* 1.00 0.96 0.61 0.60*  CALCIUM 9.2 7.9* 8.0* 8.5* 8.6*  MG  --   --   --  2.3  --    GFR: Estimated Creatinine Clearance: 111.4 mL/min (A) (by C-G formula based on SCr of 0.6 mg/dL (L)).   Liver Function Tests: Recent Labs  Lab 05/12/18 1959 05/14/18 0801 05/15/18 0027  AST 12* 9* 13*  ALT _1 ALKPHOS 92 59 60  BILITOT 1.4* 0.7 0.4  PROT 7.4 5.3* 5.6*  ALBUMIN 4.4 3.0* 3.2*   Recent Labs  Lab 05/12/18 1959  LIPASE 31   Coagulation Profile: Recent Labs  Lab 05/13/18 0048  INR 1.13   Cardiac Enzymes: Recent Labs  Lab 05/14/18 1215 05/14/18 1917 05/15/18 0027  TROPONINI <0.03 <0.03 <0.03   CBG: Recent Labs  Lab 05/14/18 1657 05/14/18 2111 05/15/18 0738 05/15/18 1110 05/15/18 1648  GLUCAP 222* 139* 138* 145* 129*   Sepsis Labs: Recent Labs  Lab 05/13/18 0048 05/13/18 0254  PROCALCITON <0.10  --   LATICACIDVEN 0.8 1.1    Recent Results (from the past 240 hour(s))  Culture, blood (Routine X 2) w Reflex to ID Panel     Status: None (Preliminary result)   Collection Time: 05/13/18 12:48 AM  Result Value Ref Range Status   Specimen Description BLOOD LEFT ANTECUBITAL  Final   Special Requests   Final    BOTTLES DRAWN AEROBIC AND ANAEROBIC Blood Culture adequate volume   Culture   Final    NO GROWTH 2 DAYS Performed at [facility], [place] [address] [place], [place] [address]    Report Status PENDING  Incomplete  Culture, blood (Routine X 2) w Reflex to ID Panel     Status: None (Preliminary result)   Collection Time: 05/13/18 12:58 AM  Result Value Ref Range Status   Specimen Description BLOOD LEFT ANTECUBITAL  Final   Special Requests   Final    BOTTLES DRAWN AEROBIC AND ANAEROBIC Blood Culture adequate volume   Culture   Final    NO GROWTH 2 DAYS Performed at [facility], [place] [address] [place], [place] [address]    Report Status PENDING   Incomplete    Radiology Studies: Dg Esophagus  Result Date: 05/14/2018 CLINICAL DATA:  Dysphagia.  Pill got stuck in throat. EXAM: ESOPHOGRAM/BARIUM SWALLOW TECHNIQUE: Single contrast examination was performed using  thin barium. FLUOROSCOPY TIME:  Fluoroscopy Time:  1 minutes and 54 seconds. Radiation Exposure Index (if provided by the fluoroscopic device): 106.3 mGy Number of Acquired Spot Images: 0 COMPARISON:  None. FINDINGS: Study limited by patient immobility. Patient was placed 30-45 degree head up on the fluoro table with LPO positioning relative to the bed position. Thin barium was administered from a cup and the esophagus was monitored fluoroscopically while the patient with swallowing. Disruption of primary peristalsis noted on all swallows. Distal third of the esophagus is essentially without a primary peristaltic  wave. The anterior mucosa of the distal third of the esophagus is irregular and was difficult to fully distend due to patient position. 13 mm barium tablet passes to the level of the distal third of the esophagus but did not pass into the stomach with repeated swallows of water and thin barium. 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 Review of the recent abdomen and pelvis CT of 05/12/2018 suggests esophageal wall thickening in this same region and esophagitis would be a consideration. Upper endoscopy may prove helpful to further evaluate. Dysphagia.  Please select appropriate template. Electronically Signed   By: Misty Stanley M.D.   On: 05/14/2018 16:25   Scheduled Meds: . feeding supplement (GLUCERNA SHAKE)  237 mL Oral TID BM  . ferrous sulfate  325 mg Oral BID  . loratadine  10 mg Oral Daily  . magnesium oxide  400 mg Oral BID  . mirtazapine  15 mg Oral QHS  . multivitamin with minerals  1 tablet Oral Daily  . pantoprazole  40 mg Intravenous Q12H  . perflutren lipid microspheres (DEFINITY) IV  suspension  4 mL Intravenous Once  . polyethylene glycol  17 g Oral Daily  . risperiDONE  1 mg Oral BID  . senna-docusate  1 tablet Oral BID  . sucralfate  1 g Oral TID  . tamsulosin  0.4 mg Oral Daily   Continuous Infusions: . cefTRIAXone (ROCEPHIN)  IV 2 g (05/15/18 0120)  . dextrose 5 % 1,000 mL with sodium bicarbonate 150 mEq infusion 125 mL/hr at 05/15/18 1139     LOS: 2 days    Time spent: 30 minutes    Barton Dubois, MD [facility] Pager [phone]  If 7PM-7AM, please contact night-coverage [email or web address] Password [email or web address] 05/15/2018, 4:55 PM

## 2018-05-16 DIAGNOSIS — F329 Major depressive disorder, single episode, unspecified: Secondary | ICD-10-CM

## 2018-05-16 LAB — MAGNESIUM: MAGNESIUM: 2.3 mg/dL (ref 1.7–2.4)

## 2018-05-16 LAB — GLUCOSE, CAPILLARY
Glucose-Capillary: 128 mg/dL — ABNORMAL HIGH (ref 70–99)
Glucose-Capillary: 177 mg/dL — ABNORMAL HIGH (ref 70–99)

## 2018-05-16 LAB — BASIC METABOLIC PANEL
Anion gap: 8 (ref 5–15)
BUN: 5 mg/dL — AB (ref 6–20)
CHLORIDE: 106 mmol/L (ref 98–111)
CO2: 29 mmol/L (ref 22–32)
CREATININE: 0.48 mg/dL — AB (ref 0.61–1.24)
Calcium: 8.3 mg/dL — ABNORMAL LOW (ref 8.9–10.3)
GFR calc Af Amer: >60 mL/min (ref >60)
Glucose, Bld: 130 mg/dL — ABNORMAL HIGH (ref 70–99)
POTASSIUM: 2.9 mmol/L — AB (ref 3.5–5.1)
SODIUM: 143 mmol/L (ref 135–145)

## 2018-05-16 MED ORDER — POTASSIUM CHLORIDE CRYS ER 20 MEQ PO TBCR
40.0000 meq | EXTENDED_RELEASE_TABLET | ORAL | Status: DC
  Administered 2018-05-16: 40 meq via ORAL
  Filled 2018-05-16: qty 2

## 2018-05-16 MED ORDER — NICOTINE 21 MG/24HR TD PT24: 21.0000 mg | MEDICATED_PATCH | Freq: Every day | TRANSDERMAL | 1 refills | Status: AC | PRN

## 2018-05-16 MED ORDER — SUCRALFATE 1 GM/10ML PO SUSP: 1.0000 g | Freq: Three times a day (TID) | ORAL | 0 refills | Status: DC

## 2018-05-16 MED ORDER — PANTOPRAZOLE SODIUM 40 MG PO TBEC: 40.0000 mg | DELAYED_RELEASE_TABLET | Freq: Two times a day (BID) | ORAL | 1 refills | Status: AC

## 2018-05-16 MED ORDER — POTASSIUM CHLORIDE CRYS ER 20 MEQ PO TBCR: 40.0000 meq | EXTENDED_RELEASE_TABLET | Freq: Two times a day (BID) | ORAL | 0 refills | Status: AC

## 2018-05-16 MED ORDER — CEFDINIR 300 MG PO CAPS: 300.0000 mg | ORAL_CAPSULE | Freq: Two times a day (BID) | ORAL | 0 refills | Status: AC

## 2018-05-16 NOTE — Progress Notes (Signed)
Nutrition Follow-up  DOCUMENTATION CODES:   Not applicable  INTERVENTION:   -Once diet is advanced, resume:  -Glucerna Shake po TID, each supplement provides 220 kcal and 10 grams of protein -MVI with minerals daily  NUTRITION DIAGNOSIS:   Inadequate oral intake related to decreased appetite as evidenced by meal completion < 50%.  Progressing  GOAL:   Patient will meet greater than or equal to 90% of their needs  Progressing  MONITOR:   PO intake, Supplement acceptance, Labs, Weight trends, Skin, I & O's  REASON FOR ASSESSMENT:   Malnutrition Screening Tool    ASSESSMENT:   Brian Cisneros is a 57 y.o. male with medical history significant of hypertension, former smoker (quit month ago), GERD, depression, who presents with nausea, vomiting, lower abdominal pain and hematemesis.  9/8- s/p cystoscopy with retrograde pyelogram/uretal stent placement 9/9- remeron initiated 9/10- pt with difficulty swallowing 9/11- s/p EGD which revealed severe ulcerative esophagitis, duodenal ulcer, and some ongoing gastritis  Reviewed I/O's: -23 ml x 24 hours and +4.6 L since admission  Pt receiving nursing care at time of visit. Per MD, swallowing function has improved and plan to advance diet as tolerated. Noted meal completion 25-80%.   Labs reviewed: K: 2.9, CBGS: 127-129.   Diet Order:   Diet Order            Diet clear liquid Room service appropriate? Yes; Fluid consistency: Thin  Diet effective now              EDUCATION NEEDS:   Not appropriate for education at this time  Skin:  Skin Assessment: Reviewed RN Assessment  Last BM:  05/14/18  Height:   Ht Readings from Last 1 Encounters:  05/13/18 5\' 8"  (1.727 m)    Weight:   Wt Readings from Last 1 Encounters:  05/16/18 87.5 kg    Ideal Body Weight:  70 kg  BMI:  Body mass index is 29.32 kg/m.  Estimated Nutritional Needs:   Kcal:  1900-2100  Protein:  90-105 grams  Fluid:  1.9-2.1  L    Kambry Takacs A. Mayford KnifeWilliams, RD, LDN, CDE Pager: [phone] After hours Pager: [phone]

## 2018-05-16 NOTE — Anesthesia Postprocedure Evaluation (Signed)
Anesthesia Post Note  Patient: Brian Cisneros  Procedure(s) Performed: ESOPHAGOGASTRODUODENOSCOPY (EGD) WITH PROPOFOL (N/A ) BIOPSY     Patient location during evaluation: Endoscopy Anesthesia Type: MAC Level of consciousness: awake and alert Pain management: pain level controlled Vital Signs Assessment: post-procedure vital signs reviewed and stable Respiratory status: spontaneous breathing, nonlabored ventilation, respiratory function stable and patient connected to nasal cannula oxygen Cardiovascular status: stable and blood pressure returned to baseline Postop Assessment: no apparent nausea or vomiting Anesthetic complications: no    Last Vitals:  Vitals:   05/16/18 0838 05/16/18 1206  BP: (!) 143/78 128/74  Pulse: 83 79  Resp:    Temp: 36.9 C 36.9 C  SpO2: 98% 95%    Last Pain:  Vitals:   05/16/18 1206  TempSrc: Oral  PainSc: 0-No pain                 [name] [name]

## 2018-05-16 NOTE — Discharge Summary (Signed)
Physician Discharge Summary  Brian Cisneros JEH:[id number] DOB: 1961-03-17 DOA: 05/12/2018  PCP: [facility], [facility]  Admit date: 05/12/2018 Discharge date: 05/16/2018  Time spent: 35 minutes  Recommendations for Outpatient Follow-up:  1. Repeat BMET to follow electrolytes and renal function 2. Repeat CBC to follow Hgb trend  3. Follow final H. Pylori results and if needed start appropriate therapy.   Discharge Diagnoses:  Principal Problem:   Sepsis due to urinary tract infection ([place]) Active Problems:   Ureteral stone with hydronephrosis   Tobacco abuse   Essential hypertension   GERD (gastroesophageal reflux disease)   Depression   Hematemesis   AKI (acute kidney injury) ([place])   Sepsis ([place])   Acute pyelonephritis   Dehydration   Dysphagia   Discharge Condition: stable and improved. Discharge home with instructions to follow up with PCP and urology service as an outpatient.  Diet recommendation: heart healthy diet.  Filed Weights   05/14/18 0519 05/15/18 0439 05/16/18 0459  Weight: 87.4 kg 88.4 kg 87.5 kg    Brief History of present illness:  Brian Singeris a 57 y.o.malewith medical history significant ofhypertension, former smoker (quit month ago), GERD, depression, who presents with nausea, vomiting, lower abdominal pain and hematemesis.  Patient states that he has been having nausea, vomiting and lower abdominal pain for more than 4 days,which has worsened today. He states that he vomited at least 5 times, sometimes with dark bloody materials.His abdominal pain is located in the lower abdomen, constant, sharp, 8 out of 10 severity, nonradiating. He states that he has a burning on urination, but denies dysuria or increased urinary frequency. He has subjective fever and chills. His temperature is normal 97.6 in ED. Patient does not have chest pain, shortness breath, cough.No unilateral weakness. Per his mother, patient was treated with antibiotics for  UTI 3 weeks ago, but they did not know the name of antibiotics.  ED Course:pt was found to haveWBC 23.5, AKI with creatinine 1.79, lipase 31, urinalysis (negative for leukocytes, rare bacteria, clear appearance, WBC 11-20), temperature normal, tachycardia, tachypnea, oxygen saturation 97% on room air,temperature 97.6.  Hospital Course:  #1 sepsis secondary to pyelonephritis/UTI/Infected stone secondary to obstructive urethral stone with hydronephrosis -Patient on admission met criteria for sepsis with leukocytosis, tachycardia, tachypnea, urinalysis with pyuria with a WBC of 11-20, patient also noted to be acidotic.   -Patient pancultured; no growth by time of discharge.  -Patient seen in consultation by urology and underwent cystoscopy with retrograde pyelogram and urethral stent placement 05/13/2018.   -WBC's now WNL, no fever and otherwise feeling better. -transition to PO abx's at discharge and aim for 10 more days of antibiotics. Discharge on cefdininr 345m BID.  2.  Dehydration -improved/resolved with IVF's -now on tolerating full liquid/soft diet -advised to keep himself well hydrated.  3.  Hypertension -BP stable and rising. -resume home antihypertensive regimen.   4.  Tobacco abuse -I have discussed tobacco cessation with the patient. I have counseled the patient regarding the negative impacts of continued tobacco use including but not limited to lung cancer, COPD, and cardiovascular disease. I have discussed alternatives to tobacco and modalities that may help facilitate tobacco cessation including but not limited to biofeedback, hypnosis, and medications. Total time spent with tobacco counseling was 5 minutes. -discharge with nicotine patch prescription.  5.  Hematemesis/nausea/vomiting/abdominal pain -Likely associated with UTI symptoms  -Hematemesis may have been due to Mallory-Weiss tear and esophagitis. -No further hematemesis appreciated -s/p EGD on 05/15/18; will  continue PPI BID  as recommended by GI -tolerating diet   6.  Gastroesophageal reflux disease/esophagitis and duodenal ulcer -continue PPI BID -discharge also on carafate X 2 weeks. -instructed to stop using NSAID's -H. Pylori cultures taken with EGD.  7.  Acute renal failure -most likely secondary to obstructive uropathy and a prerenal azotemia in the setting of UTI/acute pyelonephritis.   -continue rocephin -patient s/p cystoscopy with retrograde pyelogram and urethral stent placement 05/13/2018.   -Cr back to normal now  -will need outpatient definitive treatment for nephrolithiasis -follow up appointment with Dr. Tresa Moore (urology service) to be arranged by [facility].  8.  Acidosis -Likely secondary to acute infection/sepsis and acute renal failure.     -improved/resolved after receiving treatment with bicarb drip.   -advised to keep himself well hydrated and to complete antibiotic therapy.  9.  Dysphagia/severe ulcerative esophagitis, gastritis and duodenal ulcer -Patient with complaints of difficulty swallowing solids complains that he feels it gets stuck.   -Status post endoscopy by Dr. Penelope Coop that demonstrated severe ulcerative esophagitis and duodenal ulcer there was also some ongoing gastritis.  -Clear liquid diet overnight and depending clinical response further advancement in a.m. -Continue PPI twice a day and Carafate.    10.  Hypokalemia -repleted -Mg WNL -discharge on potassium supplementation -repeat BMET at follow up visit to reassess electrolytes trend.  Procedures:  CT abdomen and pelvis 05/12/2018  Cystoscopy with retrograde pyelogram and ureteral stent placement per interventional radiology, Dr. Pascal Lux 05/13/2018 and Dr Tresa Moore urology 05/13/2018.  Chest x-ray 05/22/2018  Endoscopy 05/15/2018: Dr. Penelope Coop  Consultations:  Urology  GI  Discharge Exam: Vitals:   05/16/18 0838 05/16/18 1206  BP: (!) 143/78 128/74  Pulse: 83 79  Resp:    Temp: 98.5 F  (36.9 C) 98.5 F (36.9 C)  SpO2: 98% 95%   General exam: Alert, awake, oriented x 3; reports no chest pain, no nausea, no vomiting.  He is tolerating full liquid/soft diet and is bale to take PO meds. Denies dysuria and reported good urine output. Respiratory system: Clear to auscultation. Respiratory effort normal. Cardiovascular system:RRR. No murmurs, rubs, gallops. Gastrointestinal system: Abdomen is nondistended, soft and just mild discomfort in epigastric pain with deep palpation. No organomegaly or masses felt. Normal bowel sounds heard. Central nervous system: Alert and oriented. No focal neurological deficits. Extremities: No C/C/E, +pedal pulses Skin: No rashes, lesions or ulcers Psychiatry: Judgement and insight appear normal. Mood & affect appropriate  Discharge Instructions   Discharge Instructions    Diet - low sodium heart healthy   Complete by:  As directed    Discharge instructions   Complete by:  As directed    Take medications as prescribed  Follow soft diet  Maintain yourself well hydrated  Please arrange follow up with PCP in 10 days Please follow up with urology service (office will contact you with appointment details) Avoid the use of NSAID's and stop smoking.     Allergies as of 05/16/2018   No Known Allergies     Medication List    STOP taking these medications   methocarbamol 500 MG tablet Commonly known as:  ROBAXIN   omeprazole 20 MG capsule Commonly known as:  PRILOSEC     TAKE these medications   cefdinir 300 MG capsule Commonly known as:  OMNICEF Take 1 capsule (300 mg total) by mouth 2 (two) times daily for 10 days.   cetirizine 10 MG tablet Commonly known as:  ZYRTEC Take 10 mg by mouth at bedtime as  needed for allergies.   ferrous sulfate 325 (65 FE) MG EC tablet Take 325 mg by mouth 2 (two) times daily.   gabapentin 300 MG capsule Commonly known as:  NEURONTIN Take 300 mg by mouth 4 (four) times daily.   lisinopril 20 MG  tablet Commonly known as:  PRINIVIL,ZESTRIL Take 0.5 tablets (10 mg total) by mouth daily.   Magnesium Oxide 420 MG Tabs Take 420 mg by mouth 2 (two) times daily.   mirtazapine 15 MG tablet Commonly known as:  REMERON Take 15 mg by mouth at bedtime.   nicotine 21 mg/24hr patch Commonly known as:  NICODERM CQ - dosed in mg/24 hours Place 1 patch (21 mg total) onto the skin daily as needed (tobacco withdraw).   pantoprazole 40 MG tablet Commonly known as:  PROTONIX Take 1 tablet (40 mg total) by mouth 2 (two) times daily.   potassium chloride SA 20 MEQ tablet Commonly known as:  K-DUR,KLOR-CON Take 2 tablets (40 mEq total) by mouth 2 (two) times daily. What changed:  how much to take   prazosin 1 MG capsule Commonly known as:  MINIPRESS Take 1 mg by mouth at bedtime.   risperiDONE 2 MG tablet Commonly known as:  RISPERDAL Take 1 mg by mouth 2 (two) times daily.   sucralfate 1 GM/10ML suspension Commonly known as:  CARAFATE Take 10 mLs (1 g total) by mouth 3 (three) times daily for 14 days.   tamsulosin 0.4 MG Caps capsule Commonly known as:  FLOMAX Take 1 capsule (0.4 mg total) by mouth daily.   zolpidem 10 MG tablet Commonly known as:  AMBIEN Take 10 mg by mouth at bedtime as needed for sleep.      No Known Allergies Follow-up Information    [facility], [facility] Schedule an appointment as soon as possible for a visit in 10 day(s).   Contact information: [address] [place] [address] [phone]        Alexis Frock, MD. Go to.   Specialty:  Urology Why:  Dr. Tresa Moore or one of his colleagues at [facility]; office will contact you with appointments details.  Contact information: [place]  [phone]            The results of significant diagnostics from this hospitalization (including imaging, microbiology, ancillary and laboratory) are listed below for reference.    Significant  Diagnostic Studies: Ct Abdomen Pelvis W Contrast  Result Date: 05/12/2018 CLINICAL DATA:  Abdominal pain and neutropenia EXAM: CT ABDOMEN AND PELVIS WITH CONTRAST TECHNIQUE: Multidetector CT imaging of the abdomen and pelvis was performed using the standard protocol following bolus administration of intravenous contrast. CONTRAST:  145m ISOVUE-300 IOPAMIDOL (ISOVUE-300) INJECTION 61% COMPARISON:  CT abdomen pelvis 03/29/2014 FINDINGS: LOWER CHEST: There is no basilar pleural or apical pericardial effusion. HEPATOBILIARY: The hepatic contours and density are normal. There is no intra- or extrahepatic biliary dilatation. Status post cholecystectomy. PANCREAS: The pancreatic parenchymal contours are normal and there is no ductal dilatation. There is no peripancreatic fluid collection. SPLEEN: Normal. ADRENALS/URINARY TRACT: --Adrenal glands: Normal. --Right kidney/ureter: There are multiple nonobstructing renal calculi, measuring up to 5 mm. No hydronephrosis. --Left kidney/ureter: There is an obstructing stone within the proximal left ureter that measures 7 x 8 x 10 mm. There is mild proximal hydroureter with minimal perinephric edema. There are multiple other left renal calculi that measure up to 9 mm. --Urinary bladder: Markedly distended. STOMACH/BOWEL: --Stomach/Duodenum: There is no hiatal hernia or other  gastric abnormality. The duodenal course and caliber are normal. --Small bowel: No dilatation or inflammation. --Colon: No focal abnormality. --Appendix: Normal. VASCULAR/LYMPHATIC: Normal course and caliber of the major abdominal vessels. No abdominal or pelvic lymphadenopathy. REPRODUCTIVE: Prostate is enlarged measuring 5.2 cm in transverse dimension, with calcifications. MUSCULOSKELETAL. Compression deformities of T12, L1 and L2 are unchanged. OTHER: None. IMPRESSION: 1. Left obstructive uropathy with 7 x 8 x 10 mm stone in the proximal left ureter causing mild hydroureter and pelviectasis. 2. Multiple  bilateral nonobstructive renal calculi measuring up to 5 mm on the right and 9 mm on the left. 3. Enlarged prostate and markedly distended urinary bladder. 4. Chronic compression deformities of T12, L1 and L2. Electronically Signed   By: Ulyses Jarred M.D.   On: 05/12/2018 23:18   Dg Esophagus  Result Date: 05/14/2018 CLINICAL DATA:  Dysphagia.  Pill got stuck in throat. EXAM: ESOPHOGRAM/BARIUM SWALLOW TECHNIQUE: Single contrast examination was performed using  thin barium. FLUOROSCOPY TIME:  Fluoroscopy Time:  1 minutes and 54 seconds. Radiation Exposure Index (if provided by the fluoroscopic device): 106.3 mGy Number of Acquired Spot Images: 0 COMPARISON:  None. FINDINGS: Study limited by patient immobility. Patient was placed 30-45 degree head up on the fluoro table with LPO positioning relative to the bed position. Thin barium was administered from a cup and the esophagus was monitored fluoroscopically while the patient with swallowing. Disruption of primary peristalsis noted on all swallows. Distal third of the esophagus is essentially without a primary peristaltic wave. The anterior mucosa of the distal third of the esophagus is irregular and was difficult to fully distend due to patient position. 13 mm barium tablet passes to the level of the distal third of the esophagus but did not pass into the stomach with repeated swallows of water and thin barium. 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 Review of the recent abdomen and pelvis CT of 05/12/2018 suggests esophageal wall thickening in this same region and esophagitis would be a consideration. Upper endoscopy may prove helpful to further evaluate. Dysphagia.  Please select appropriate template. Electronically Signed   By: Misty Stanley M.D.   On: 05/14/2018 16:25   Dg Retrograde Pyelogram  Result Date: 05/13/2018 CLINICAL DATA:  Cystoscopy with retrograde pyelogram and ureteral  stent placement. EXAM: RETROGRADE PYELOGRAM COMPARISON:  CT abdomen and pelvis-05/12/2018 FLUOROSCOPY TIME:  49 seconds FINDINGS: 8 spot intraoperative fluoroscopic images during bilateral retrograde pyelogram and ureteral stent placement are provided for review Initial image demonstrates selective cannulation of the left ureter. There is a persistent near occlusive filling defect within the proximal aspect the left ureter, likely compatible with the partially obstructing ureteral stone demonstrated on preceding abdominal CT. Note is again made of mild upstream left-sided pelvicaliectasis. Subsequent image demonstrates placement of a left-sided double-J ureteral stent with superior coil overlying the left renal pelvis and inferior coil overlying the urinary bladder. Subsequent images demonstrate selective cannulation opacification of the right ureter. There are no discrete filling defects within the opacified portions of the right renal collecting system. No definite evidence of pelvicaliectasis given contrast injection. Subsequent images demonstrate placement of a double-J ureteral stent with superior coil overlying the right renal pelvis and inferior coil overlying the urinary bladder. IMPRESSION: Bilateral retrograde pyelogram and ureteral stent placement as above. Electronically Signed   By: Sandi Mariscal M.D.   On: 05/13/2018 07:43   Dg Chest Portable 1 View  Result Date: 05/12/2018 CLINICAL  DATA:  Hematemesis EXAM: PORTABLE CHEST 1 VIEW COMPARISON:  Portable exam 2027 hours compared to 03/29/2014 FINDINGS: Normal heart size, mediastinal contours, and pulmonary vascularity. Lungs clear. No pulmonary infiltrate, pleural effusion or pneumothorax. No free air under the diaphragms. Old healed LEFT rib fractures. Bones demineralized. IMPRESSION: No acute abnormalities. Electronically Signed   By: Lavonia Dana M.D.   On: 05/12/2018 21:05    Microbiology: Recent Results (from the past 240 hour(s))  Culture, blood  (Routine X 2) w Reflex to ID Panel     Status: None (Preliminary result)   Collection Time: 05/13/18 12:48 AM  Result Value Ref Range Status   Specimen Description BLOOD LEFT ANTECUBITAL  Final   Special Requests   Final    BOTTLES DRAWN AEROBIC AND ANAEROBIC Blood Culture adequate volume   Culture   Final    NO GROWTH 3 DAYS Performed at [facility], [address], [place], [place] [address]    Report Status PENDING  Incomplete  Culture, blood (Routine X 2) w Reflex to ID Panel     Status: None (Preliminary result)   Collection Time: 05/13/18 12:58 AM  Result Value Ref Range Status   Specimen Description BLOOD LEFT ANTECUBITAL  Final   Special Requests   Final    BOTTLES DRAWN AEROBIC AND ANAEROBIC Blood Culture adequate volume   Culture   Final    NO GROWTH 3 DAYS Performed at [facility], [place] [address] [place], [place] [address]    Report Status PENDING  Incomplete     Labs: Basic Metabolic Panel: Recent Labs  Lab 05/13/18 0630 05/13/18 1240 05/14/18 0801 05/15/18 0027 05/16/18 0446  NA 139 139 142 142 143  K 3.6 3.5 2.9* 3.1* 2.9*  CL 118* 117* 120* 110 106  CO2 12* 14* 15* 24 29  GLUCOSE 134* 141* 120* 117* 130*  BUN 23* _0 5*  CREATININE 1.00 0.96 0.61 0.60* 0.48*  CALCIUM 7.9* 8.0* 8.5* 8.6* 8.3*  MG  --   --  2.3  --  2.3   Liver Function Tests: Recent Labs  Lab 05/12/18 1959 05/14/18 0801 05/15/18 0027  AST 12* 9* 13*  ALT _1 ALKPHOS 92 59 60  BILITOT 1.4* 0.7 0.4  PROT 7.4 5.3* 5.6*  ALBUMIN 4.4 3.0* 3.2*   Recent Labs  Lab 05/12/18 1959  LIPASE 31   No results for input(s): AMMONIA in the last 168 hours. CBC: Recent Labs  Lab 05/13/18 0630 05/13/18 1240 05/13/18 1835 05/14/18 0801 05/15/18 0027  WBC 18.0* 14.0* 12.3* 10.7* 10.2  NEUTROABS  --   --   --  7.6 6.8  HGB 14.1 13.0 12.6* 12.6* 12.5*  HCT 43.5 39.8 38.7* 38.9* 36.3*  MCV 86.8 85.8 85.6 84.6 82.1  PLT 283 248 243 222 243   Cardiac  Enzymes: Recent Labs  Lab 05/14/18 1215 05/14/18 1917 05/15/18 0027  TROPONINI <0.03 <0.03 <0.03   CBG: Recent Labs  Lab 05/15/18 1110 05/15/18 1648 05/15/18 2120 05/16/18 0805 05/16/18 1132  GLUCAP 145* 129* 127* 128* 177*     Signed:  Barton Dubois MD.  [facility] 05/16/2018, 2:07 PM

## 2018-05-16 NOTE — Progress Notes (Signed)
[facility] Progress Note  Subjective: Patient states he is doing better today. Swallowing is better. He has severe ulcerative esophagitis as well as a large duodenal ulcer. See EGD report from yesterday.  Objective: Vital signs in last 24 hours: Temp:  [98.2 F (36.8 C)-99 F (37.2 C)] 98.5 F (36.9 C) (09/12 0838) Pulse Rate:  [71-84] 83 (09/12 0838) Resp:  [17-22] 18 (09/12 0459) BP: (116-149)/(71-96) 143/78 (09/12 0838) SpO2:  [95 %-99 %] 98 % (09/12 0838) Weight:  [87.5 kg] 87.5 kg (09/12 0459) Weight change: -0.907 kg   PE:  No distress  Abdomen nontender  Lab Results: Results for orders placed or performed during the hospital encounter of 05/12/18 (from the past 24 hour(s))  Glucose, capillary     Status: Abnormal   Collection Time: 05/15/18 11:10 AM  Result Value Ref Range   Glucose-Capillary 145 (H) 70 - 99 mg/dL   Comment 1 Notify RN   Glucose, capillary     Status: Abnormal   Collection Time: 05/15/18  4:48 PM  Result Value Ref Range   Glucose-Capillary 129 (H) 70 - 99 mg/dL   Comment 1 Notify RN   Glucose, capillary     Status: Abnormal   Collection Time: 05/15/18  9:20 PM  Result Value Ref Range   Glucose-Capillary 127 (H) 70 - 99 mg/dL  Basic metabolic panel     Status: Abnormal   Collection Time: 05/16/18  4:46 AM  Result Value Ref Range   Sodium 143 135 - 145 mmol/L   Potassium 2.9 (L) 3.5 - 5.1 mmol/L   Chloride 106 98 - 111 mmol/L   CO2 29 22 - 32 mmol/L   Glucose, Bld 130 (H) 70 - 99 mg/dL   BUN 5 (L) 6 - 20 mg/dL   Creatinine, Ser 4.090.48 (L) 0.61 - 1.24 mg/dL   Calcium 8.3 (L) 8.9 - 10.3 mg/dL   GFR calc non Af Amer >60 >60 mL/min   GFR calc Af Amer >60 >60 mL/min   Anion gap 8 5 - 15  Magnesium     Status: None   Collection Time: 05/16/18  4:46 AM  Result Value Ref Range   Magnesium 2.3 1.7 - 2.4 mg/dL  Glucose, capillary     Status: Abnormal   Collection Time: 05/16/18  8:05 AM  Result Value Ref Range   Glucose-Capillary 128  (H) 70 - 99 mg/dL    Studies/Results: No results found.    Assessment: Severe ulcerative esophagitis  Large duodenal ulcer  Plan:   Advance diet as tolerated  Recommend when he is swallowing better to switch pantoprazole to 40 mg by mouth twice a day. Doubt he needs sucralfate. Check biopsies for H. Pylori. If positive treat it. Needs to avoid NSAIDs. We will sign off at this point .  call us if needed.    SAM F GANEM 05/16/2018, 9:04 AM  Pager: [phone] If no answer or after 5 PM call [phone]

## 2018-05-18 LAB — CULTURE, BLOOD (ROUTINE X 2)
CULTURE: NO GROWTH
Culture: NO GROWTH
SPECIAL REQUESTS: ADEQUATE
SPECIAL REQUESTS: ADEQUATE

## 2018-06-11 ENCOUNTER — Other Ambulatory Visit: Payer: Self-pay | Admitting: Urology

## 2018-07-01 ENCOUNTER — Other Ambulatory Visit: Payer: Self-pay

## 2018-07-01 ENCOUNTER — Encounter (HOSPITAL_BASED_OUTPATIENT_CLINIC_OR_DEPARTMENT_OTHER): Payer: Self-pay | Admitting: *Deleted

## 2018-07-01 NOTE — Progress Notes (Signed)
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

## 2018-07-05 ENCOUNTER — Encounter (HOSPITAL_BASED_OUTPATIENT_CLINIC_OR_DEPARTMENT_OTHER): Payer: Self-pay | Admitting: Emergency Medicine

## 2018-07-05 ENCOUNTER — Ambulatory Visit (HOSPITAL_BASED_OUTPATIENT_CLINIC_OR_DEPARTMENT_OTHER)
Admission: RE | Admit: 2018-07-05 | Discharge: 2018-07-05 | Disposition: A | Discharge status: Home / Self Care | Payer: No Typology Code available for payment source | Source: Ambulatory Visit | Attending: Urology | Admitting: Urology

## 2018-07-05 ENCOUNTER — Encounter (HOSPITAL_BASED_OUTPATIENT_CLINIC_OR_DEPARTMENT_OTHER)
Admission: RE | Disposition: A | Discharge status: Home / Self Care | Payer: Self-pay | Source: Ambulatory Visit | Attending: Urology

## 2018-07-05 ENCOUNTER — Ambulatory Visit (HOSPITAL_BASED_OUTPATIENT_CLINIC_OR_DEPARTMENT_OTHER): Payer: No Typology Code available for payment source | Admitting: Certified Registered Nurse Anesthetist

## 2018-07-05 ENCOUNTER — Other Ambulatory Visit: Payer: Self-pay

## 2018-07-05 DIAGNOSIS — N200 Calculus of kidney: Secondary | ICD-10-CM | POA: Insufficient documentation

## 2018-07-05 DIAGNOSIS — Z7984 Long term (current) use of oral hypoglycemic drugs: Secondary | ICD-10-CM | POA: Diagnosis not present

## 2018-07-05 DIAGNOSIS — I10 Essential (primary) hypertension: Secondary | ICD-10-CM | POA: Diagnosis not present

## 2018-07-05 DIAGNOSIS — K219 Gastro-esophageal reflux disease without esophagitis: Secondary | ICD-10-CM | POA: Diagnosis not present

## 2018-07-05 DIAGNOSIS — F4310 Post-traumatic stress disorder, unspecified: Secondary | ICD-10-CM | POA: Insufficient documentation

## 2018-07-05 DIAGNOSIS — G4730 Sleep apnea, unspecified: Secondary | ICD-10-CM | POA: Diagnosis not present

## 2018-07-05 DIAGNOSIS — Z87891 Personal history of nicotine dependence: Secondary | ICD-10-CM | POA: Insufficient documentation

## 2018-07-05 DIAGNOSIS — E119 Type 2 diabetes mellitus without complications: Secondary | ICD-10-CM | POA: Diagnosis not present

## 2018-07-05 DIAGNOSIS — E872 Acidosis: Secondary | ICD-10-CM | POA: Insufficient documentation

## 2018-07-05 DIAGNOSIS — F329 Major depressive disorder, single episode, unspecified: Secondary | ICD-10-CM | POA: Insufficient documentation

## 2018-07-05 HISTORY — DX: Type 2 diabetes mellitus without complications: E11.9

## 2018-07-05 HISTORY — PX: HOLMIUM LASER APPLICATION: SHX5852

## 2018-07-05 HISTORY — DX: Personal history of urinary calculi: Z87.442

## 2018-07-05 HISTORY — PX: CYSTOSCOPY WITH RETROGRADE PYELOGRAM, URETEROSCOPY AND STENT PLACEMENT: SHX5789

## 2018-07-05 HISTORY — DX: Sleep apnea, unspecified: G47.30

## 2018-07-05 LAB — POCT I-STAT, CHEM 8
BUN: 7 mg/dL (ref 6–20)
CALCIUM ION: 1.1 mmol/L — AB (ref 1.15–1.40)
Chloride: 108 mmol/L (ref 98–111)
Creatinine, Ser: 0.7 mg/dL (ref 0.61–1.24)
Glucose, Bld: 130 mg/dL — ABNORMAL HIGH (ref 70–99)
HEMATOCRIT: 38 % — AB (ref 39.0–52.0)
HEMOGLOBIN: 12.9 g/dL — AB (ref 13.0–17.0)
Potassium: 3.8 mmol/L (ref 3.5–5.1)
SODIUM: 140 mmol/L (ref 135–145)
TCO2: 22 mmol/L (ref 22–32)

## 2018-07-05 LAB — GLUCOSE, CAPILLARY: GLUCOSE-CAPILLARY: 119 mg/dL — AB (ref 70–99)

## 2018-07-05 SURGERY — CYSTOURETEROSCOPY, WITH RETROGRADE PYELOGRAM AND STENT INSERTION
Anesthesia: General | Site: Renal | Laterality: Bilateral

## 2018-07-05 MED ORDER — DEXAMETHASONE SODIUM PHOSPHATE 10 MG/ML IJ SOLN
INTRAMUSCULAR | Status: DC | PRN
  Administered 2018-07-05: 10 mg via INTRAVENOUS

## 2018-07-05 MED ORDER — PROPOFOL 10 MG/ML IV BOLUS
INTRAVENOUS | Status: AC
  Filled 2018-07-05: qty 20

## 2018-07-05 MED ORDER — MIDAZOLAM HCL 2 MG/2ML IJ SOLN
INTRAMUSCULAR | Status: AC
  Filled 2018-07-05: qty 2

## 2018-07-05 MED ORDER — PROPOFOL 10 MG/ML IV BOLUS
INTRAVENOUS | Status: DC | PRN
  Administered 2018-07-05: 200 mg via INTRAVENOUS

## 2018-07-05 MED ORDER — ONDANSETRON HCL 4 MG/2ML IJ SOLN
4.0000 mg | Freq: Once | INTRAMUSCULAR | Status: DC | PRN
  Filled 2018-07-05: qty 2

## 2018-07-05 MED ORDER — GENTAMICIN SULFATE 40 MG/ML IJ SOLN
5.0000 mg/kg | INTRAVENOUS | Status: DC
  Filled 2018-07-05: qty 11

## 2018-07-05 MED ORDER — LIDOCAINE 2% (20 MG/ML) 5 ML SYRINGE
INTRAMUSCULAR | Status: AC
  Filled 2018-07-05: qty 5

## 2018-07-05 MED ORDER — GENTAMICIN SULFATE 40 MG/ML IJ SOLN
400.0000 mg | INTRAVENOUS | Status: AC
  Administered 2018-07-05: 400 mg via INTRAVENOUS
  Filled 2018-07-05: qty 10

## 2018-07-05 MED ORDER — LACTATED RINGERS IV SOLN
INTRAVENOUS | Status: DC
  Administered 2018-07-05 (×2): via INTRAVENOUS
  Filled 2018-07-05: qty 1000

## 2018-07-05 MED ORDER — IOHEXOL 300 MG/ML  SOLN
INTRAMUSCULAR | Status: DC | PRN
  Administered 2018-07-05: 18 mL via URETHRAL

## 2018-07-05 MED ORDER — CIPROFLOXACIN HCL 500 MG PO TABS: 500.0000 mg | ORAL_TABLET | Freq: Two times a day (BID) | ORAL | 0 refills | Status: AC

## 2018-07-05 MED ORDER — FENTANYL CITRATE (PF) 100 MCG/2ML IJ SOLN
INTRAMUSCULAR | Status: AC
  Filled 2018-07-05: qty 2

## 2018-07-05 MED ORDER — LIDOCAINE HCL (CARDIAC) PF 100 MG/5ML IV SOSY
PREFILLED_SYRINGE | INTRAVENOUS | Status: DC | PRN
  Administered 2018-07-05: 80 mg via INTRAVENOUS

## 2018-07-05 MED ORDER — ONDANSETRON HCL 4 MG/2ML IJ SOLN
INTRAMUSCULAR | Status: DC | PRN
  Administered 2018-07-05: 4 mg via INTRAVENOUS

## 2018-07-05 MED ORDER — SODIUM CHLORIDE 0.9 % IR SOLN
Status: DC | PRN
  Administered 2018-07-05: 3000 mL via INTRAVESICAL

## 2018-07-05 MED ORDER — MIDAZOLAM HCL 2 MG/2ML IJ SOLN
INTRAMUSCULAR | Status: DC | PRN
  Administered 2018-07-05: 2 mg via INTRAVENOUS

## 2018-07-05 MED ORDER — SENNOSIDES-DOCUSATE SODIUM 8.6-50 MG PO TABS: 1.0000 | ORAL_TABLET | Freq: Two times a day (BID) | ORAL | 0 refills | Status: AC

## 2018-07-05 MED ORDER — PHENYLEPHRINE 40 MCG/ML (10ML) SYRINGE FOR IV PUSH (FOR BLOOD PRESSURE SUPPORT)
PREFILLED_SYRINGE | INTRAVENOUS | Status: DC | PRN
  Administered 2018-07-05 (×3): 80 ug via INTRAVENOUS

## 2018-07-05 MED ORDER — OXYCODONE HCL 5 MG PO TABS
5.0000 mg | ORAL_TABLET | Freq: Once | ORAL | Status: AC | PRN
  Administered 2018-07-05: 5 mg via ORAL
  Filled 2018-07-05: qty 1

## 2018-07-05 MED ORDER — OXYCODONE HCL 5 MG/5ML PO SOLN
5.0000 mg | Freq: Once | ORAL | Status: AC | PRN
  Filled 2018-07-05: qty 5

## 2018-07-05 MED ORDER — DEXAMETHASONE SODIUM PHOSPHATE 10 MG/ML IJ SOLN
INTRAMUSCULAR | Status: AC
  Filled 2018-07-05: qty 1

## 2018-07-05 MED ORDER — FENTANYL CITRATE (PF) 100 MCG/2ML IJ SOLN
25.0000 ug | INTRAMUSCULAR | Status: DC | PRN
  Filled 2018-07-05: qty 1

## 2018-07-05 MED ORDER — OXYCODONE HCL 5 MG PO TABS
ORAL_TABLET | ORAL | Status: AC
  Filled 2018-07-05: qty 1

## 2018-07-05 MED ORDER — HYDROCODONE-ACETAMINOPHEN 5-325 MG PO TABS: 1.0000 | ORAL_TABLET | Freq: Four times a day (QID) | ORAL | 0 refills | Status: AC | PRN

## 2018-07-05 MED ORDER — FENTANYL CITRATE (PF) 100 MCG/2ML IJ SOLN
INTRAMUSCULAR | Status: DC | PRN
  Administered 2018-07-05 (×4): 50 ug via INTRAVENOUS

## 2018-07-05 MED ORDER — ONDANSETRON HCL 4 MG/2ML IJ SOLN
INTRAMUSCULAR | Status: AC
  Filled 2018-07-05: qty 2

## 2018-07-05 SURGICAL SUPPLY — 30 items
BAG DRAIN URO-CYSTO SKYTR STRL (DRAIN) ×3 IMPLANT
BASKET LASER NITINOL 1.9FR (BASKET) IMPLANT
CATH INTERMIT  6FR 70CM (CATHETERS) ×3 IMPLANT
CLOTH BEACON ORANGE TIMEOUT ST (SAFETY) ×3 IMPLANT
FIBER LASER FLEXIVA 365 (UROLOGICAL SUPPLIES) IMPLANT
FIBER LASER TRAC TIP (UROLOGICAL SUPPLIES) ×6 IMPLANT
GLOVE BIO SURGEON STRL SZ 6.5 (GLOVE) ×2 IMPLANT
GLOVE BIO SURGEON STRL SZ7.5 (GLOVE) ×3 IMPLANT
GLOVE BIO SURGEONS STRL SZ 6.5 (GLOVE) ×1
GLOVE BIOGEL PI IND STRL 6.5 (GLOVE) ×1 IMPLANT
GLOVE BIOGEL PI INDICATOR 6.5 (GLOVE) ×2
GOWN STRL REUS W/ TWL LRG LVL3 (GOWN DISPOSABLE) ×1 IMPLANT
GOWN STRL REUS W/TWL LRG LVL3 (GOWN DISPOSABLE) ×5 IMPLANT
GUIDEWIRE ANG ZIPWIRE 038X150 (WIRE) ×6 IMPLANT
GUIDEWIRE STR DUAL SENSOR (WIRE) ×6 IMPLANT
INFUSOR MANOMETER BAG 3000ML (MISCELLANEOUS) IMPLANT
IV NS 1000ML (IV SOLUTION) ×2
IV NS 1000ML BAXH (IV SOLUTION) ×1 IMPLANT
IV NS IRRIG 3000ML ARTHROMATIC (IV SOLUTION) ×6 IMPLANT
KIT TURNOVER CYSTO (KITS) ×3 IMPLANT
MANIFOLD NEPTUNE II (INSTRUMENTS) ×3 IMPLANT
NS IRRIG 500ML POUR BTL (IV SOLUTION) ×6 IMPLANT
PACK CYSTO (CUSTOM PROCEDURE TRAY) ×3 IMPLANT
SHEATH URETERAL 12FRX35CM (MISCELLANEOUS) ×3 IMPLANT
STENT POLARIS 5FRX26 (STENTS) ×6 IMPLANT
SYR 10ML LL (SYRINGE) ×3 IMPLANT
TUBE CONNECTING 12'X1/4 (SUCTIONS) ×1
TUBE CONNECTING 12X1/4 (SUCTIONS) ×2 IMPLANT
TUBE FEEDING 8FR 16IN STR KANG (MISCELLANEOUS) ×3 IMPLANT
TUBING UROLOGY SET (TUBING) ×3 IMPLANT

## 2018-07-05 NOTE — Discharge Instructions (Signed)
1 - You may have urinary urgency (bladder spasms) and bloody urine on / off with stent in place. This is normal.  2 - Call MD or go to ER for fever >102, severe pain / nausea / vomiting not relieved by medications, or acute change in medical status.  [facility] [phone] Post Cystoscopy With or Without Stent Instructions   You may see some blood in your urine while the stent is in place and a few days afterwards. Do not be alarmed, even if the urine was clear for a while. Get off your feet and drink lots of fluids until clearing occurs. If you start to pass clots or don't improve, call us.  DIET: You may return to your normal diet immediately. Because of the raw surface of your bladder, alcohol, spicy foods, acid type foods and drinks with caffeine may cause irritation or frequency and should be used in moderation. To keep your urine flowing freely and to avoid constipation, drink plenty of fluids during the day ( 8-10 glasses ). Tip: Avoid cranberry juice because it is very acidic.  ACTIVITY: Your physical activity doesn't need to be restricted. However, if you are very active, you may see some blood in your urine. We suggest that you reduce your activity under these circumstances until the bleeding has stopped.  BOWELS: It is important to keep your bowels regular during the postoperative period. Straining with bowel movements can cause bleeding. A bowel movement every other day is reasonable. Use a mild laxative if needed, such as Milk of Magnesia 2-3 tablespoons, or 2 Dulcolax tablets. Call if you continue to have problems. If you have been taking narcotics for pain, before, during or after your surgery, you may be constipated. Take a laxative if necessary.   MEDICATION: You should resume your pre-surgery medications unless told not to. In addition you will often be given an antibiotic to prevent infection. These should be taken as prescribed until the bottles are  finished unless you are having an unusual reaction to one of the drugs.  PROBLEMS YOU SHOULD REPORT TO [place]:  Fevers over 100.5 Fahrenheit.  Heavy bleeding, or clots ( See above notes about blood in urine ).  Inability to urinate.  Drug reactions ( hives, rash, nausea, vomiting, diarrhea ).  Severe burning or pain with urination that is not improving.  Post Anesthesia Home Care Instructions  Activity: Get plenty of rest for the remainder of the day. A responsible individual must stay with you for 24 hours following the procedure.  For the next 24 hours, DO NOT: -Drive a car -Advertising copywriter -Drink alcoholic beverages -Take any medication unless instructed by your physician -Make any legal decisions or sign important papers.  Meals: Start with liquid foods such as gelatin or soup. Progress to regular foods as tolerated. Avoid greasy, spicy, heavy foods. If nausea and/or vomiting occur, drink only clear liquids until the nausea and/or vomiting subsides. Call your physician if vomiting continues.  Special Instructions/Symptoms: Your throat may feel dry or sore from the anesthesia or the breathing tube placed in your throat during surgery. If this causes discomfort, gargle with warm salt water. The discomfort should disappear within 24 hours.

## 2018-07-05 NOTE — Anesthesia Procedure Notes (Signed)
Procedure Name: LMA Insertion Date/Time: 07/05/2018 11:56 AM Performed by: Yolonda Kida, CRNA Pre-anesthesia Checklist: Patient identified, Emergency Drugs available, Suction available and Patient being monitored Patient Re-evaluated:Patient Re-evaluated prior to induction Oxygen Delivery Method: Circle system utilized Preoxygenation: Pre-oxygenation with 100% oxygen Induction Type: IV induction LMA: LMA inserted LMA Size: 4.0 Number of attempts: 1 Placement Confirmation: positive ETCO2,  CO2 detector and breath sounds checked- equal and bilateral Tube secured with: Tape Dental Injury: Teeth and Oropharynx as per pre-operative assessment

## 2018-07-05 NOTE — Op Note (Signed)
Brian Cisneros, [facility] MEDICAL RECORD [id number] ACCOUNT [id number] DATE OF BIRTH:1960/09/09 FACILITY: [facility] LOCATION: [facility] PHYSICIAN:Kentrell Hallahan, MD  OPERATIVE REPORT  DATE OF PROCEDURE:  07/05/2018  PREOPERATIVE DIAGNOSIS:  Left ureteral and bilateral renal stones.  PROCEDURE: 1.  Cystoscopy, bilateral pyelograms. 2.  Bilateral ureteroscopy with laser lithotripsy. 3.  Exchange of bilateral ureteral stents 5 x 26 Polaris, no tether.  ESTIMATED BLOOD LOSS:  Nil.  COMPLICATIONS:  None.  SPECIMENS:  Left ureteral and bilateral renal stone fragments for composition analysis.  FINDINGS: 1.  Left mid ureteral large stone. 2.  Bilateral multifocal papillary tip renal stones. 3.  Complete resolution of all accessible stone fragments larger than 130 mm following laser lithotripsy and extraction. 4.  Successful placement of bilateral ureteral stents, proximal in the renal pelvis, distal in urinary bladder.  INDICATIONS:  The patient is a 57 year old gentleman with history of psychiatric comorbidity and recurrent stone disease.  He was found on workup of left flank pain and sepsis to have left ureteral stone, bilateral renal stones in late September 2019.   He underwent bilateral stenting at that time.  Hospital admission for intravenous antibiotics.  He eventually cleared his infectious parameters.  He now presents for definitive stone management with goal of stone free.  Informed consent was obtained and  placed in medical record.  PROCEDURE IN DETAIL:  The patient being identified and procedure being bilateral ureteroscopic stimulation was confirmed.  Procedure timeout was performed.  Intravenous antibiotics were administered.  General anesthesia introduced.  The patient was  placed into a low lithotomy position.  Sterile field was created by prepping the patient's penis, perineum and proximal thighs using iodine.  Cystourethroscopy was performed using a 22-French rigid  cystoscope with offset lens.  Inspection of anterior and  posterior urethra was unremarkable.  Inspection of bladder revealed bilateral distal stents in situ.  There was mild encrustation.  There was significant ureteral edema around the aspect of the stents.  Both stents were removed and set aside for  discard.  They were inspected and intact.  The left ureteral orifice was cannulated with a sequential catheter and left retrograde pyelogram was obtained.  Left retrograde pyelogram shows a single left ureter, single-system left kidney.  There was a filling defect in the midureter consistent with known stone.  A 0.038 ZIPwire was advanced to the lower pole and set aside as a safety wire.  Similarly, right  retrograde pyelogram was obtained.  Right retrograde pyelogram demonstrated a single right ureter, single-system right kidney.  No filling defects or narrowing noted.  A separate 0.038 ZIPwire was advanced to lower pole and set aside as a safety wire.  An 8-French feeding tube was placed  in the urinary bladder for pressure release.  Semirigid ureteroscopy was performed of the distal four-fifths of the right ureter alongside a separate sensor working wire.  No mucosal abnormalities were found.  Similarly, semirigid ureteroscopy was  performed along a separate sensor working wire up the left ureter in the midureter.  An impacted stone was noted.  This was much too large for simple basketing.  As such, holmium laser energy applied at settings of 0.3 joules and 30 Hz.  Using a dusting  technique, approximately 60% of the stone was dusted.  It was quite dense.  The remaining 40% of the stone was fragmented into pieces 1-2 mm, which were then sequentially grasped on the long axis with an escape basket, removed and sent for composition  analysis.  Semirigid ureteroscopy of  the remainder of the distal orifice, left ureter, revealed complete resolution of all accessible stone fragments as the goal today was to  achieve stone-free status.  The semirigid scope was exchanged for a 12/14 x 35  cm ureteral access sheath using continuous fluoroscopic guidance using the sensor working wire, and flexible digital ureteroscopy was performed of approximately the left ureter and systematic inspection of the left kidney, including all calices x3.   There were multifocal papillary tip calcifications.  Mostly, these were amenable to simple basketing.  All accessible fragments were basketed out in their entirety.  The access sheath was under continuous vision.  No mucosal abnormalities were found.   Similarly, the access sheath was then placed over the right sensor working wire to the level of the proximal right ureter using continuous fluoroscopic guidance, and flexible digital ureteroscopy was performed in the proximal ureter and systematic  inspection of the right kidney.  There were multifocal papillary tip calcifications, mostly in the mid to lower areas.  There were 2 dominant lower stones that were too large for simple basketing.  They were repositioned into the lower aspect of the  renal pelvis, and holmium laser energy was applied to these stones using aforementioned settings and fragmented into approximately 2 small pieces each that were then sequentially grasped and escape basket removed and set aside for composition analysis.   Following these maneuvers, there was complete resolution of all accessible stone fragments on the right side.  The right access used has been on continuous vision.  No mucosal abnormalities were found.  Given the bilateral nature of the procedure today  and bilateral access sheath usage, it was felt that interval stenting would be warranted with a nontethered stent.  As such, a new 5 x 26 Polaris-type stent was placed on the safety wire on the right side using fluoroscopic guidance.  Good proximal and  distal points were noted.  Similarly, a separate 5 x 26 Polaris-type stent was placed over the  remaining left safety wire using fluoroscopic guidance.  Good proximal and distal points were noted.  The procedure was then terminated.  The patient tolerated  the procedure well.  No immediate perioperative complications.  The patient was taken to the postanesthesia care in stable condition with plan for discharge.  LN/NUANCE  D:07/05/2018 T:07/05/2018 JOB:[id number]

## 2018-07-05 NOTE — Anesthesia Preprocedure Evaluation (Addendum)
Anesthesia Evaluation  Patient identified by MRN, date of birth, ID band Patient awake    Reviewed: Allergy & Precautions, NPO status , Patient's Chart, lab work & pertinent test results  History of Anesthesia Complications Negative for: history of anesthetic complications  Airway Mallampati: IV  TM Distance: >3 FB Neck ROM: Full    Dental  (+) Poor Dentition, Missing Many missing teeth upper and lower with overall poor dentition. Per patient no loose teeth.:   Pulmonary sleep apnea and Continuous Positive Airway Pressure Ventilation , former smoker,    Pulmonary exam normal        Cardiovascular hypertension, Normal cardiovascular exam     Neuro/Psych PSYCHIATRIC DISORDERS Depression negative neurological ROS     GI/Hepatic Neg liver ROS, GERD  Medicated,  Endo/Other  diabetes, Oral Hypoglycemic Agents  Renal/GU negative Renal ROS  negative genitourinary   Musculoskeletal negative musculoskeletal ROS (+)   Abdominal   Peds  Hematology negative hematology ROS (+)   Anesthesia Other Findings   Reproductive/Obstetrics                            Anesthesia Physical Anesthesia Plan  ASA: III  Anesthesia Plan: General   Post-op Pain Management:    Induction: Intravenous  PONV Risk Score and Plan: 2 and Ondansetron, Dexamethasone and Treatment may vary due to age or medical condition  Airway Management Planned: LMA  Additional Equipment: None  Intra-op Plan:   Post-operative Plan: Extubation in OR  Informed Consent: I have reviewed the patients History and Physical, chart, labs and discussed the procedure including the risks, benefits and alternatives for the proposed anesthesia with the patient or authorized representative who has indicated his/her understanding and acceptance.     Plan Discussed with:   Anesthesia Plan Comments:        Anesthesia Quick Evaluation

## 2018-07-05 NOTE — Anesthesia Postprocedure Evaluation (Signed)
Anesthesia Post Note  Patient: Brian Cisneros  Procedure(s) Performed: CYSTOSCOPY WITH RETROGRADE PYELOGRAM, URETEROSCOPY STONE BASKETTING, AND STENT EXCHANGE (Bilateral Renal) HOLMIUM LASER APPLICATION (Bilateral Renal)     Patient location during evaluation: PACU Anesthesia Type: General Level of consciousness: awake and alert Pain management: pain level controlled Vital Signs Assessment: post-procedure vital signs reviewed and stable Respiratory status: spontaneous breathing, nonlabored ventilation and respiratory function stable Cardiovascular status: blood pressure returned to baseline and stable Postop Assessment: no apparent nausea or vomiting Anesthetic complications: no    Last Vitals:  Vitals:   07/05/18 1415 07/05/18 1434  BP: 138/84 140/86  Pulse: 83 79  Resp: 15 16  Temp:  36.6 C  SpO2: 95% 97%    Last Pain:  Vitals:   07/05/18 1434  TempSrc:   PainSc: 4                  Lucretia Kern

## 2018-07-05 NOTE — Transfer of Care (Signed)
Immediate Anesthesia Transfer of Care Note  Patient: Brian Cisneros  Procedure(s) Performed: CYSTOSCOPY WITH RETROGRADE PYELOGRAM, URETEROSCOPY STONE BASKETTING, AND STENT EXCHANGE (Bilateral Renal) HOLMIUM LASER APPLICATION (Bilateral Renal)  Patient Location: PACU  Anesthesia Type:General  Level of Consciousness: drowsy and patient cooperative  Airway & Oxygen Therapy: Patient Spontanous Breathing and Patient connected to face mask oxygen  Post-op Assessment: Report given to RN and Post -op Vital signs reviewed and stable  Post vital signs: Reviewed and stable  Last Vitals:  Vitals Value Taken Time  BP 138/84 07/05/2018  1:30 PM  Temp    Pulse 85 07/05/2018  1:33 PM  Resp 23 07/05/2018  1:33 PM  SpO2 95 % 07/05/2018  1:33 PM  Vitals shown include unvalidated device data.  Last Pain:  Vitals:   07/05/18 0928  TempSrc: Oral  PainSc: 4       Patients Stated Pain Goal: 3 (07/05/18 8119)  Complications: No apparent anesthesia complications

## 2018-07-05 NOTE — H&P (Signed)
Brian Cisneros is an 57 y.o. male.    Chief Complaint: Pre-OP BILATERAL Ureteroscopic Stone Manipulation  HPI:   1 - Recurrent Urolithiasis -  Pre 2019 - multiple stone treatmetns through [facility]  05/2018 - Left ureteral, Bilateral Renal Stones - Left 9mm mid ureteral stone with moderate hydro + bilateral scattered renal stones (about 1cm volume each kidney) by ER CT on eval left flank pain and malaise. Long h/o recurrent stones, follows Dr. Terie Purser at [facility].   2 - Medical Stone Disease:  Eval 2019: BMP, PTH, Urate - pending; Composition - pending; 24 Hr Urines - pending   3 - Urosepsis - malaise, chills, leukocytosis to 23k, lactic acidosis and pyuria c/w early urosepsis in setting of obstructing stone. Started on empiric rocephin 05/13/18. BCX, UCX 9/9 no growth.    PMH sig for GERD, PTSD, DM2. PCP is with [facility].   Today "Brian Cisneros" is seen to proceed with BILATERAL ureteroscopic stone manipulation with goal of stone free. Most recent UCX negative. No interval fevers.    Past Medical History:  Diagnosis Date  . Depression    PTSD per mother  . Diabetes mellitus without complication (HCC)    type 2, patient does not check cbg at home  . History of kidney stones   . Hypertension   . Sleep apnea     Past Surgical History:  Procedure Laterality Date  . BIOPSY  05/15/2018   Procedure: BIOPSY;  Surgeon: Graylin Shiver, MD;  Location: [facility] ENDOSCOPY;  Service: Endoscopy;;  . CHOLECYSTECTOMY    . CYSTOSCOPY W/ URETERAL STENT PLACEMENT N/A 05/13/2018   Procedure: CYSTOSCOPY WITH RETROGRADE PYELOGRAM/URETERAL STENT PLACEMENT;  Surgeon: Sebastian Ache, MD;  Location: [facility] OR;  Service: Urology;  Laterality: N/A;  . CYSTOSCOPY/RETROGRADE/URETEROSCOPY/STONE EXTRACTION WITH BASKET Right 03/29/2014   Procedure: CYSTOSCOPY/RETROGRADE/URETEROSCOPY/STONE EXTRACTION WITH BASKET, STENT PLACEMENT;  Surgeon: Kathi Ludwig, MD;  Location: [facility] ORS;  Service: Urology;  Laterality:  Right;  . ESOPHAGOGASTRODUODENOSCOPY (EGD) WITH PROPOFOL N/A 05/15/2018   Procedure: ESOPHAGOGASTRODUODENOSCOPY (EGD) WITH PROPOFOL;  Surgeon: Graylin Shiver, MD;  Location: [facility] ENDOSCOPY;  Service: Endoscopy;  Laterality: N/A;    Family History  Problem Relation Age of Onset  . Diabetes Mellitus II Mother   . Diabetes Mellitus II Sister   . Arthritis/Rheumatoid Sister    Social History:  reports that he has quit smoking. His smoking use included cigarettes. He has never used smokeless tobacco. He reports that he drinks alcohol. He reports that he does not use drugs.  Allergies: No Known Allergies  No medications prior to admission.    No results found for this or any previous visit (from the past 48 hour(s)). No results found.  Review of Systems  Constitutional: Negative.  Negative for chills and fever.  HENT: Negative.   Eyes: Negative.   Respiratory: Negative.   Cardiovascular: Negative.   Gastrointestinal: Negative.   Genitourinary: Positive for frequency and urgency.  Musculoskeletal: Negative.   Skin: Negative.   Neurological: Negative.   Endo/Heme/Allergies: Negative.   Psychiatric/Behavioral: Negative.     Height 5\' 8"  (1.727 m), weight 93 kg. Physical Exam  Constitutional: He appears well-developed.  HENT:  Head: Normocephalic.  Eyes: Pupils are equal, round, and reactive to light.  Neck: Normal range of motion.  Cardiovascular: Normal rate.  GI: Soft.  Genitourinary:  Genitourinary Comments: No CVAT at present  Musculoskeletal: Normal range of motion.  Skin: Skin is warm.  Psychiatric: He has a normal mood and affect.  Assessment/Plan  Proceed as planned with BILATERAL ureteroscopic stone manipulation. Risks, benefits, alternatives, expected peri-op course discussed previously and reiterated today including need for at least temporary stents again post-op since bilateral procedure.  Sebastian Ache, MD 07/05/2018, 7:27 AM

## 2018-07-05 NOTE — Brief Op Note (Signed)
07/05/2018  1:28 PM  PATIENT:  Brian Cisneros  57 y.o. male  PRE-OPERATIVE DIAGNOSIS:  BILATERAL RENAL STONES  POST-OPERATIVE DIAGNOSIS:  BILATERAL RENAL STONES  PROCEDURE:  Procedure(s) with comments: CYSTOSCOPY WITH RETROGRADE PYELOGRAM, URETEROSCOPY STONE BASKETTING, AND STENT EXCHANGE (Bilateral) - 90 MINS HOLMIUM LASER APPLICATION (Bilateral)  SURGEON:  Surgeon(s) and Role:    Sebastian Ache, MD - Primary  PHYSICIAN ASSISTANT:   ASSISTANTS: none   ANESTHESIA:   general  EBL:  minimal   BLOOD ADMINISTERED:none  DRAINS: none   LOCAL MEDICATIONS USED:  NONE  SPECIMEN:  Source of Specimen:  bilateral renal / ureteral stone fragments  DISPOSITION OF SPECIMEN:  [facility] for compositional analysis  COUNTS:  YES  TOURNIQUET:  * No tourniquets in log *  DICTATION: .Other Dictation: Dictation Number [id number]  PLAN OF CARE: Discharge to home after PACU  PATIENT DISPOSITION:  PACU - hemodynamically stable.   Delay start of Pharmacological VTE agent (>24hrs) due to surgical blood loss or risk of bleeding: yes

## 2018-07-08 ENCOUNTER — Encounter (HOSPITAL_BASED_OUTPATIENT_CLINIC_OR_DEPARTMENT_OTHER): Payer: Self-pay | Admitting: Urology

## 2021-02-26 ENCOUNTER — Ambulatory Visit
Admission: EM | Admit: 2021-02-26 | Discharge: 2021-02-26 | Disposition: A | Discharge status: Home / Self Care | Payer: No Typology Code available for payment source | Attending: Emergency Medicine | Admitting: Emergency Medicine

## 2021-02-26 ENCOUNTER — Other Ambulatory Visit: Payer: Self-pay

## 2021-02-26 ENCOUNTER — Encounter: Payer: Self-pay | Admitting: Emergency Medicine

## 2021-02-26 DIAGNOSIS — J069 Acute upper respiratory infection, unspecified: Secondary | ICD-10-CM

## 2021-02-26 MED ORDER — ALBUTEROL SULFATE HFA 108 (90 BASE) MCG/ACT IN AERS: 1.0000 | INHALATION_SPRAY | Freq: Four times a day (QID) | RESPIRATORY_TRACT | 0 refills | Status: AC | PRN

## 2021-02-26 MED ORDER — BENZONATATE 200 MG PO CAPS: 200.0000 mg | ORAL_CAPSULE | Freq: Three times a day (TID) | ORAL | 0 refills | Status: AC | PRN

## 2021-02-26 MED ORDER — DM-GUAIFENESIN ER 30-600 MG PO TB12: 1.0000 | ORAL_TABLET | Freq: Two times a day (BID) | ORAL | 0 refills | Status: AC
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
## 2021-02-26 NOTE — Discharge Instructions (Addendum)
COVID/flu test pending-monitor MyChart for results Tessalon every 8 hours for cough Use Mucinex DM twice daily for cough and congestion Albuterol inhaler 1 to 2 puffs as needed for any chest tightness, shortness of breath Rest and fluids Alternate Tylenol and ibuprofen every 4 hours to help with fevers, chills, body aches Follow-up if not seeing any improvement with the above over the next 3 to 4 days.

## 2021-02-26 NOTE — ED Triage Notes (Signed)
Pt here for cough and fever x 2 days  

## 2021-02-27 LAB — COVID-19, FLU A+B NAA
Influenza A, NAA: NOT DETECTED
Influenza B, NAA: NOT DETECTED
SARS-CoV-2, NAA: NOT DETECTED

## 2024-02-23 ENCOUNTER — Emergency Department (HOSPITAL_COMMUNITY)

## 2024-02-23 ENCOUNTER — Encounter (HOSPITAL_COMMUNITY): Payer: Self-pay

## 2024-02-23 ENCOUNTER — Emergency Department (HOSPITAL_COMMUNITY)
Admission: EM | Admit: 2024-02-23 | Discharge: 2024-02-23 | Disposition: A | Discharge status: Home / Self Care | Attending: Student | Admitting: Student

## 2024-02-23 DIAGNOSIS — S0081XA Abrasion of other part of head, initial encounter: Secondary | ICD-10-CM | POA: Diagnosis not present

## 2024-02-23 DIAGNOSIS — Z87891 Personal history of nicotine dependence: Secondary | ICD-10-CM | POA: Diagnosis not present

## 2024-02-23 DIAGNOSIS — Z7984 Long term (current) use of oral hypoglycemic drugs: Secondary | ICD-10-CM | POA: Insufficient documentation

## 2024-02-23 DIAGNOSIS — M1712 Unilateral primary osteoarthritis, left knee: Secondary | ICD-10-CM | POA: Diagnosis not present

## 2024-02-23 DIAGNOSIS — I10 Essential (primary) hypertension: Secondary | ICD-10-CM | POA: Diagnosis not present

## 2024-02-23 DIAGNOSIS — M25562 Pain in left knee: Secondary | ICD-10-CM | POA: Diagnosis present

## 2024-02-23 DIAGNOSIS — W1830XA Fall on same level, unspecified, initial encounter: Secondary | ICD-10-CM | POA: Insufficient documentation

## 2024-02-23 DIAGNOSIS — E119 Type 2 diabetes mellitus without complications: Secondary | ICD-10-CM | POA: Insufficient documentation

## 2024-02-23 DIAGNOSIS — Z79899 Other long term (current) drug therapy: Secondary | ICD-10-CM | POA: Diagnosis not present

## 2024-02-23 DIAGNOSIS — M1710 Unilateral primary osteoarthritis, unspecified knee: Secondary | ICD-10-CM

## 2024-02-23 LAB — CBC WITH DIFFERENTIAL/PLATELET
Abs Immature Granulocytes: 0.11 10*3/uL — ABNORMAL HIGH (ref 0.00–0.07)
Basophils Absolute: 0.1 10*3/uL (ref 0.0–0.1)
Basophils Relative: 1 %
Eosinophils Absolute: 0.1 10*3/uL (ref 0.0–0.5)
Eosinophils Relative: 1 %
HCT: 44.5 % (ref 39.0–52.0)
Hemoglobin: 14.8 g/dL (ref 13.0–17.0)
Immature Granulocytes: 1 %
Lymphocytes Relative: 21 %
Lymphs Abs: 2.4 10*3/uL (ref 0.7–4.0)
MCH: 28.4 pg (ref 26.0–34.0)
MCHC: 33.3 g/dL (ref 30.0–36.0)
MCV: 85.2 fL (ref 80.0–100.0)
Monocytes Absolute: 0.9 10*3/uL (ref 0.1–1.0)
Monocytes Relative: 8 %
Neutro Abs: 7.7 10*3/uL (ref 1.7–7.7)
Neutrophils Relative %: 68 %
Platelets: 271 10*3/uL (ref 150–400)
RBC: 5.22 MIL/uL (ref 4.22–5.81)
RDW: 13.2 % (ref 11.5–15.5)
WBC: 11.2 10*3/uL — ABNORMAL HIGH (ref 4.0–10.5)
nRBC: 0 % (ref 0.0–0.2)

## 2024-02-23 LAB — COMPREHENSIVE METABOLIC PANEL WITH GFR
ALT: 22 U/L (ref 0–44)
AST: 21 U/L (ref 15–41)
Albumin: 3.8 g/dL (ref 3.5–5.0)
Alkaline Phosphatase: 76 U/L (ref 38–126)
Anion gap: 11 (ref 5–15)
BUN: 6 mg/dL — ABNORMAL LOW (ref 8–23)
CO2: 24 mmol/L (ref 22–32)
Calcium: 8.8 mg/dL — ABNORMAL LOW (ref 8.9–10.3)
Chloride: 105 mmol/L (ref 98–111)
Creatinine, Ser: 0.84 mg/dL (ref 0.61–1.24)
GFR, Estimated: >60 mL/min (ref >60)
Glucose, Bld: 125 mg/dL — ABNORMAL HIGH (ref 70–99)
Potassium: 3.7 mmol/L (ref 3.5–5.1)
Sodium: 140 mmol/L (ref 135–145)
Total Bilirubin: 0.9 mg/dL (ref 0.0–1.2)
Total Protein: 6.3 g/dL — ABNORMAL LOW (ref 6.5–8.1)

## 2024-02-23 LAB — TROPONIN I (HIGH SENSITIVITY)
Troponin I (High Sensitivity): 4 ng/L (ref <18)
Troponin I (High Sensitivity): 4 ng/L (ref <18)

## 2024-02-23 NOTE — Discharge Instructions (Addendum)
 Wear the knee sleeve for comfort.  Follow-up with an orthopedic doctor to be rechecked.  Return to the ED for fevers chills fainting spells or other concerns

## 2024-02-23 NOTE — ED Provider Notes (Addendum)
 Patient initially seen by Dr. Delwyn.  Please see his note.  Patient felt like his knee buckled on him.  Patient denied loss of consciousness.  He states he just scraped his face.  Patient states he feels well at this time.  No chest pain or shortness of breath.  No abdominal pain.  His ED workup shows no significant electrolyte abnormalities.  Initial troponin was normal.  CBC shows a slight white count but patient's not having any infectious symptoms.  Patient's x-rays show signs of arthritis but no acute fracture or dislocation.  Patient has been able to walk around without any difficulty.  No recurrent symptoms.  Patient does not appear to have any acute emergency medical condition.  It sounds like his knee gave out on him.  Will have him follow-up with orthopedics.  Will provide a knee sleeve.  Recommend return to the ED if he has any trouble with passing out fevers chills or other concerns      Randol Simmonds, MD 02/23/24 1734

## 2024-02-23 NOTE — Progress Notes (Signed)
 Orthopedic Tech Progress Note Patient Details:  Revin Corker [birth year] [id number] Applied knee brace per order.  Ortho Devices Type of Ortho Device: Knee Sleeve Ortho Device/Splint Location: LLE Ortho Device/Splint Interventions: Ordered, Application, Adjustment   Post Interventions Patient Tolerated: Well Instructions Provided: Adjustment of device, Care of device, Poper ambulation with device  Morna Pink 02/23/2024, 6:15 PM
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
